# Patient Record
Sex: Female | Born: 1980 | Race: White | Hispanic: No | Marital: Married | Smoking: Current every day smoker
Health system: Southern US, Community
[De-identification: ages and names within clinical notes are randomized; demographics above are authoritative.]

## PROBLEM LIST (undated history)

## (undated) DIAGNOSIS — E079 Disorder of thyroid, unspecified: Secondary | ICD-10-CM

## (undated) HISTORY — PX: CARPAL TUNNEL RELEASE: SHX101

## (undated) HISTORY — PX: OTHER SURGICAL HISTORY: SHX169

## (undated) HISTORY — PX: ANKLE SURGERY: SHX546

---

## 2014-09-12 ENCOUNTER — Emergency Department (HOSPITAL_COMMUNITY)
Admission: EM | Admit: 2014-09-12 | Discharge: 2014-09-12 | Disposition: A | Discharge status: Home / Self Care | Payer: Medicaid - Out of State | Attending: Emergency Medicine | Admitting: Emergency Medicine

## 2014-09-12 ENCOUNTER — Encounter (HOSPITAL_COMMUNITY): Payer: Self-pay | Admitting: *Deleted

## 2014-09-12 DIAGNOSIS — Z720 Tobacco use: Secondary | ICD-10-CM | POA: Diagnosis not present

## 2014-09-12 DIAGNOSIS — Z8639 Personal history of other endocrine, nutritional and metabolic disease: Secondary | ICD-10-CM | POA: Insufficient documentation

## 2014-09-12 DIAGNOSIS — Z880 Allergy status to penicillin: Secondary | ICD-10-CM | POA: Diagnosis not present

## 2014-09-12 DIAGNOSIS — J0110 Acute frontal sinusitis, unspecified: Secondary | ICD-10-CM | POA: Diagnosis not present

## 2014-09-12 DIAGNOSIS — R110 Nausea: Secondary | ICD-10-CM | POA: Diagnosis not present

## 2014-09-12 DIAGNOSIS — H9209 Otalgia, unspecified ear: Secondary | ICD-10-CM | POA: Diagnosis not present

## 2014-09-12 DIAGNOSIS — R05 Cough: Secondary | ICD-10-CM | POA: Diagnosis present

## 2014-09-12 HISTORY — DX: Disorder of thyroid, unspecified: E07.9

## 2014-09-12 MED ORDER — SALINE SPRAY 0.65 % NA SOLN: 1.0000 | NASAL | Status: DC | PRN

## 2014-09-12 MED ORDER — DOXYCYCLINE HYCLATE 100 MG PO CAPS: 100.0000 mg | ORAL_CAPSULE | Freq: Two times a day (BID) | ORAL | Status: DC

## 2014-09-12 NOTE — ED Notes (Signed)
Nasal congestion, drainage is dark green in color. Dry cough. Sinus pressure.

## 2014-09-12 NOTE — Discharge Instructions (Signed)
You may take 400mg  - 600mg  Ibuprofen (Motrin) every 6-8 hours for fever and pain  Alternate with Tylenol  You may take 500mg  Tylenol every 4-6 hours as needed for fever and pain  Follow-up with your primary care provider next week for recheck of symptoms if not improving.  Be sure to drink plenty of fluids and rest, at least 8hrs of sleep a night, preferably more while you are sick. Return to the ED if you cannot keep down fluids/signs of dehydration, fever not reducing with Tylenol, difficulty breathing/wheezing, stiff neck, worsening condition, or other concerns (see below)

## 2014-09-12 NOTE — ED Provider Notes (Signed)
CSN: [id number]     Arrival date & time 09/12/14  4098 History   First MD Initiated Contact with Patient 09/12/14 1001     Chief Complaint  Patient presents with  . Cough     (Consider location/radiation/quality/duration/timing/severity/associated sxs/prior Treatment) HPI  Pt is a 34yo female c/o gradually worsening nasal congestion, associated with dark green mucous, frontal sinus pressure with headache and dry cough. Pt also c/o bilateral ear pain.  Symptoms started 2 weeks ago. States she has tried OTC medications w/o relief. Denies fever, chills, vomiting or diarrhea but is c/o mild nausea.  LMP: 09/05/14. Daughter is sick at home with nasal congestion, taking antibiotics. No recent travel.   Past Medical History  Diagnosis Date  . Thyroid disease     hypthyroid   Past Surgical History  Procedure Laterality Date  . Cesarean section    . Uterine ablasion     No family history on file. History  Substance Use Topics  . Smoking status: Current Every Day Smoker    Types: Cigarettes  . Smokeless tobacco: Not on file  . Alcohol Use: No   OB History    No data available     Review of Systems  Constitutional: Negative for fever, chills and appetite change.  HENT: Positive for congestion, ear pain, rhinorrhea and sore throat. Negative for ear discharge, trouble swallowing and voice change.   Respiratory: Positive for cough. Negative for shortness of breath.   Cardiovascular: Negative for chest pain and palpitations.  Gastrointestinal: Positive for nausea. Negative for vomiting, abdominal pain and diarrhea.  Musculoskeletal: Negative for myalgias, joint swelling and arthralgias.  Neurological: Positive for headaches ( frontal).  All other systems reviewed and are negative.     Allergies  Penicillins  Home Medications   Prior to Admission medications   Medication Sig Start Date End Date Taking? Authorizing Provider  doxycycline (VIBRAMYCIN) 100 MG capsule Take 1 capsule  (100 mg total) by mouth 2 (two) times daily. One po bid x 7 days 09/12/14   Junius Finner, PA-C  sodium chloride (OCEAN) 0.65 % SOLN nasal spray Place 1 spray into both nostrils as needed for congestion. 09/12/14   Junius Finner, PA-C   BP 136/85 mmHg  Pulse 83  Temp(Src) 98.2 F (36.8 C) (Oral)  Resp 16  Ht  (1.676 m)  Wt 280 lb (127.007 kg)  BMI 45.21 kg/m2  SpO2 100%  LMP 09/05/2014 Physical Exam  Constitutional: She appears well-developed and well-nourished. No distress.  HENT:  Head: Normocephalic and atraumatic.  Right Ear: Hearing, tympanic membrane, external ear and ear canal normal.  Left Ear: Hearing, tympanic membrane, external ear and ear canal normal.  Nose: Mucosal edema present. Right sinus exhibits frontal sinus tenderness. Right sinus exhibits no maxillary sinus tenderness. Left sinus exhibits frontal sinus tenderness. Left sinus exhibits no maxillary sinus tenderness.  Mouth/Throat: Uvula is midline and mucous membranes are normal. Posterior oropharyngeal erythema present. No oropharyngeal exudate, posterior oropharyngeal edema or tonsillar abscesses.  Eyes: Conjunctivae are normal. No scleral icterus.  Neck: Normal range of motion.  Cardiovascular: Normal rate, regular rhythm and normal heart sounds.   Pulmonary/Chest: Effort normal and breath sounds normal. No respiratory distress. She has no wheezes. She has no rales. She exhibits no tenderness.  Abdominal: Soft. Bowel sounds are normal. She exhibits no distension and no mass. There is no tenderness. There is no rebound and no guarding.  Musculoskeletal: Normal range of motion.  Neurological: She is alert.  Skin: Skin  is warm and dry. She is not diaphoretic.  Nursing note and vitals reviewed.   ED Course  Procedures (including critical care time) Labs Review Labs Reviewed - No data to display  Imaging Review No results found.   EKG Interpretation None      MDM   Final diagnoses:  Acute frontal  sinusitis, recurrence not specified    Pt presenting to ED with c/o gradually worsening nasal congestion and frontal headache. Pt is afebrile, appears well, non-toxic. Lungs: CTAB. Will tx for frontal sinusitis. Doubt pneumonia.  Home care instructions provided. Rx: doxycycline and ocean saline nasal spray. Advised to f/u with PCP if not improving. Return precautions provided. Pt verbalized understanding and agreement with tx plan.     Junius Finnerrin O'Malley, PA-C 09/12/14 1448  Donnetta HutchingBrian Cook, MD 09/14/14 [phone]

## 2015-02-14 ENCOUNTER — Encounter (HOSPITAL_COMMUNITY): Payer: Self-pay | Admitting: *Deleted

## 2015-02-14 ENCOUNTER — Emergency Department (HOSPITAL_COMMUNITY)
Admission: EM | Admit: 2015-02-14 | Discharge: 2015-02-14 | Disposition: A | Discharge status: Home / Self Care | Payer: Medicaid - Out of State | Attending: Emergency Medicine | Admitting: Emergency Medicine

## 2015-02-14 ENCOUNTER — Emergency Department (HOSPITAL_COMMUNITY): Payer: Medicaid - Out of State

## 2015-02-14 DIAGNOSIS — Z79899 Other long term (current) drug therapy: Secondary | ICD-10-CM | POA: Diagnosis not present

## 2015-02-14 DIAGNOSIS — R1030 Lower abdominal pain, unspecified: Secondary | ICD-10-CM | POA: Diagnosis present

## 2015-02-14 DIAGNOSIS — E039 Hypothyroidism, unspecified: Secondary | ICD-10-CM | POA: Insufficient documentation

## 2015-02-14 DIAGNOSIS — R1031 Right lower quadrant pain: Secondary | ICD-10-CM | POA: Diagnosis not present

## 2015-02-14 DIAGNOSIS — Z720 Tobacco use: Secondary | ICD-10-CM | POA: Diagnosis not present

## 2015-02-14 DIAGNOSIS — R1032 Left lower quadrant pain: Secondary | ICD-10-CM | POA: Diagnosis not present

## 2015-02-14 DIAGNOSIS — R109 Unspecified abdominal pain: Secondary | ICD-10-CM

## 2015-02-14 DIAGNOSIS — Z880 Allergy status to penicillin: Secondary | ICD-10-CM | POA: Diagnosis not present

## 2015-02-14 DIAGNOSIS — Z3202 Encounter for pregnancy test, result negative: Secondary | ICD-10-CM | POA: Insufficient documentation

## 2015-02-14 LAB — URINALYSIS, ROUTINE W REFLEX MICROSCOPIC
Bilirubin Urine: NEGATIVE
GLUCOSE, UA: NEGATIVE mg/dL
KETONES UR: NEGATIVE mg/dL
NITRITE: NEGATIVE
PH: 6 (ref 5.0–8.0)
Protein, ur: NEGATIVE mg/dL
Specific Gravity, Urine: <1.005 — ABNORMAL LOW (ref 1.005–1.030)
Urobilinogen, UA: 0.2 mg/dL (ref 0.0–1.0)

## 2015-02-14 LAB — COMPREHENSIVE METABOLIC PANEL
ALT: 20 U/L (ref 14–54)
ANION GAP: 7 (ref 5–15)
AST: 18 U/L (ref 15–41)
Albumin: 3.7 g/dL (ref 3.5–5.0)
Alkaline Phosphatase: 93 U/L (ref 38–126)
BILIRUBIN TOTAL: 0.5 mg/dL (ref 0.3–1.2)
BUN: 11 mg/dL (ref 6–20)
CO2: 28 mmol/L (ref 22–32)
Calcium: 9 mg/dL (ref 8.9–10.3)
Chloride: 104 mmol/L (ref 101–111)
Creatinine, Ser: 0.73 mg/dL (ref 0.44–1.00)
GFR calc non Af Amer: >60 mL/min (ref >60)
GLUCOSE: 94 mg/dL (ref 65–99)
POTASSIUM: 4 mmol/L (ref 3.5–5.1)
Sodium: 139 mmol/L (ref 135–145)
TOTAL PROTEIN: 7 g/dL (ref 6.5–8.1)

## 2015-02-14 LAB — I-STAT CHEM 8, ED
BUN: 10 mg/dL (ref 6–20)
Calcium, Ion: 1.2 mmol/L (ref 1.12–1.23)
Chloride: 103 mmol/L (ref 101–111)
Creatinine, Ser: 0.9 mg/dL (ref 0.44–1.00)
Glucose, Bld: 87 mg/dL (ref 65–99)
HCT: 50 % — ABNORMAL HIGH (ref 36.0–46.0)
HEMOGLOBIN: 17 g/dL — AB (ref 12.0–15.0)
Potassium: 3.9 mmol/L (ref 3.5–5.1)
SODIUM: 140 mmol/L (ref 135–145)
TCO2: 25 mmol/L (ref 0–100)

## 2015-02-14 LAB — CBC WITH DIFFERENTIAL/PLATELET
Basophils Absolute: 0 10*3/uL (ref 0.0–0.1)
Basophils Relative: 0 % (ref 0–1)
Eosinophils Absolute: 0.1 10*3/uL (ref 0.0–0.7)
Eosinophils Relative: 1 % (ref 0–5)
HCT: 46.6 % — ABNORMAL HIGH (ref 36.0–46.0)
Hemoglobin: 15.7 g/dL — ABNORMAL HIGH (ref 12.0–15.0)
LYMPHS ABS: 2.2 10*3/uL (ref 0.7–4.0)
LYMPHS PCT: 21 % (ref 12–46)
MCH: 29.9 pg (ref 26.0–34.0)
MCHC: 33.7 g/dL (ref 30.0–36.0)
MCV: 88.8 fL (ref 78.0–100.0)
Monocytes Absolute: 0.8 10*3/uL (ref 0.1–1.0)
Monocytes Relative: 8 % (ref 3–12)
Neutro Abs: 7.6 10*3/uL (ref 1.7–7.7)
Neutrophils Relative %: 70 % (ref 43–77)
Platelets: 267 10*3/uL (ref 150–400)
RBC: 5.25 MIL/uL — AB (ref 3.87–5.11)
RDW: 13.6 % (ref 11.5–15.5)
WBC: 10.9 10*3/uL — AB (ref 4.0–10.5)

## 2015-02-14 LAB — URINE MICROSCOPIC-ADD ON

## 2015-02-14 LAB — PREGNANCY, URINE: PREG TEST UR: NEGATIVE

## 2015-02-14 LAB — LIPASE, BLOOD: LIPASE: 22 U/L (ref 22–51)

## 2015-02-14 MED ORDER — KETOROLAC TROMETHAMINE 60 MG/2ML IM SOLN
30.0000 mg | Freq: Once | INTRAMUSCULAR | Status: DC
  Filled 2015-02-14: qty 2

## 2015-02-14 MED ORDER — KETOROLAC TROMETHAMINE 30 MG/ML IJ SOLN
30.0000 mg | Freq: Once | INTRAMUSCULAR | Status: AC
  Administered 2015-02-14: 30 mg via INTRAVENOUS
  Filled 2015-02-14: qty 1

## 2015-02-14 MED ORDER — IBUPROFEN 800 MG PO TABS: 800.0000 mg | ORAL_TABLET | Freq: Three times a day (TID) | ORAL | Status: DC

## 2015-02-14 MED ORDER — PROMETHAZINE HCL 25 MG PO TABS: 25.0000 mg | ORAL_TABLET | Freq: Four times a day (QID) | ORAL | Status: DC | PRN

## 2015-02-14 NOTE — ED Provider Notes (Signed)
CSN: [id number]     Arrival date & time 02/14/15  4098 History  This chart was scribed for Eber Hong, MD by Placido Sou, ED scribe. This patient was seen in room [facility] and the patient's care was started at 9:30 AM.    Chief Complaint  Patient presents with  . Abdominal Pain    The history is provided by the patient. No language interpreter was used.   HPI Comments: Ann Morrison is a 34 y.o. female, with a history of uterine ablation, UTI, and a c-section, who presents to the Emergency Department by ambulance complaining of moderate, sudden onset, lower abdominal pain that began this morning while driving. She describes it as a "stabbing" pain. She further notes a history of cysts on her ovaryies. She notes a worsening of symptoms with any type of ambulation or breathing. Pt notes her c-section was in 2014. Pt denies any history of illnesses and further denies any abnormal bowel movements, nausea, vomiting, or diarrhea.   Past Medical History  Diagnosis Date  . Thyroid disease     hypthyroid   Past Surgical History  Procedure Laterality Date  . Cesarean section    . Uterine ablasion     No family history on file. History  Substance Use Topics  . Smoking status: Current Every Day Smoker    Types: Cigarettes  . Smokeless tobacco: Not on file  . Alcohol Use: No   OB History    No data available     Review of Systems  Gastrointestinal: Positive for abdominal pain. Negative for nausea, vomiting and diarrhea.  All other systems reviewed and are negative.     Allergies  Penicillins  Home Medications   Prior to Admission medications   Medication Sig Start Date End Date Taking? Authorizing Provider  acetaminophen (TYLENOL) 500 MG tablet Take 1,000 mg by mouth every 6 (six) hours as needed for moderate pain.   Yes Historical Provider, MD  buPROPion (WELLBUTRIN SR) 150 MG 12 hr tablet Take 150 mg by mouth daily. 11/10/14  Yes Historical Provider, MD  levothyroxine  (SYNTHROID, LEVOTHROID) 125 MCG tablet Take 125 mcg by mouth daily. 01/16/15  Yes Historical Provider, MD  omeprazole (PRILOSEC OTC) 20 MG tablet Take 20 mg by mouth every other day.   Yes Historical Provider, MD  sodium chloride (OCEAN) 0.65 % SOLN nasal spray Place 1 spray into both nostrils as needed for congestion. 09/12/14  Yes Junius Finner, PA-C  doxycycline (VIBRAMYCIN) 100 MG capsule Take 1 capsule (100 mg total) by mouth 2 (two) times daily. One po bid x 7 days Patient not taking: Reported on 02/14/2015 09/12/14   Junius Finner, PA-C  ibuprofen (ADVIL,MOTRIN) 800 MG tablet Take 1 tablet (800 mg total) by mouth 3 (three) times daily. 02/14/15   Eber Hong, MD  promethazine (PHENERGAN) 25 MG tablet Take 1 tablet (25 mg total) by mouth every 6 (six) hours as needed for nausea or vomiting. 02/14/15   Eber Hong, MD   BP 126/47 mmHg  Pulse 66  Temp(Src) 98 F (36.7 C) (Oral)  Resp 16  Ht  (1.651 m)  Wt 308 lb (139.708 kg)  BMI 51.25 kg/m2  SpO2 100%  LMP 01/31/2015 Physical Exam  Constitutional: She is oriented to person, place, and time. She appears well-developed and well-nourished. No distress.  HENT:  Head: Normocephalic and atraumatic.  Mouth/Throat: Oropharynx is clear and moist. No oropharyngeal exudate.  Eyes: Conjunctivae and EOM are normal. Pupils are equal, round, and reactive  to light. Right eye exhibits no discharge. Left eye exhibits no discharge. No scleral icterus.  Neck: Normal range of motion. Neck supple. No JVD present. No thyromegaly present.  Cardiovascular: Normal rate, regular rhythm, normal heart sounds and intact distal pulses.  Exam reveals no gallop and no friction rub.   No murmur heard. Pulmonary/Chest: Effort normal and breath sounds normal. No respiratory distress. She has no wheezes. She has no rales.  Abdominal: Soft. Bowel sounds are normal. There is tenderness. There is guarding.  Acute onset of sharp and stabbing pain while driving; persistent  worsening with movement; Tenderness in bilateral lower abd; mobese bilateral lower abd tenderness with mild guarding; no upper abd tenderness;   Musculoskeletal: Normal range of motion. She exhibits no edema or tenderness.  Lymphadenopathy:    She has no cervical adenopathy.  Neurological: She is alert and oriented to person, place, and time. Coordination normal.  Skin: Skin is warm and dry. No rash noted. No erythema.  Psychiatric: She has a normal mood and affect. Her behavior is normal.  Nursing note and vitals reviewed.   ED Course  Procedures  DIAGNOSTIC STUDIES: Oxygen Saturation is 99% on RA, normal by my interpretation.    COORDINATION OF CARE: 9:36 AM Discussed treatment plan with pt at bedside and pt agreed to plan.  10:45 AM Followed up with pt and pain is rated as a 6/10. Labs and CT scan reviewed with the patient.  Labs Review Labs Reviewed  CBC WITH DIFFERENTIAL/PLATELET - Abnormal; Notable for the following:    WBC 10.9 (*)    RBC 5.25 (*)    Hemoglobin 15.7 (*)    HCT 46.6 (*)    All other components within normal limits  URINALYSIS, ROUTINE W REFLEX MICROSCOPIC (NOT AT [facility]) - Abnormal; Notable for the following:    Specific Gravity, Urine <1.005 (*)    Hgb urine dipstick TRACE (*)    Leukocytes, UA TRACE (*)    All other components within normal limits  URINE MICROSCOPIC-ADD ON - Abnormal; Notable for the following:    Squamous Epithelial / LPF MANY (*)    Bacteria, UA FEW (*)    All other components within normal limits  I-STAT CHEM 8, ED - Abnormal; Notable for the following:    Hemoglobin 17.0 (*)    HCT 50.0 (*)    All other components within normal limits  COMPREHENSIVE METABOLIC PANEL  LIPASE, BLOOD  PREGNANCY, URINE    Imaging Review Ct Renal Stone Study  02/14/2015   CLINICAL DATA:  Low pelvic pain  EXAM: CT ABDOMEN AND PELVIS WITHOUT CONTRAST  TECHNIQUE: Multidetector CT imaging of the abdomen and pelvis was performed following the standard  protocol without IV contrast.  COMPARISON:  None.  FINDINGS: BODY WALL: No contributory findings.  LOWER CHEST: No contributory findings.  ABDOMEN/PELVIS:  Liver: Borderline hepatic steatosis when compared to the spleen.  Biliary: No evidence of biliary obstruction or stone.  Pancreas: Unremarkable.  Spleen: Unremarkable.  Adrenals: Unremarkable.  Kidneys and ureters: No hydronephrosis or stone. A punctate density near the right ureter on image 71 appears external to the ureter on reformats.  Bladder: Unremarkable.  Reproductive: IUD which is in good position. No pathologic findings.  Bowel: No obstruction. No appendicitis.  Retroperitoneum: No mass or adenopathy.  Peritoneum: No ascites or pneumoperitoneum.  Vascular: No acute abnormality.  OSSEOUS: No acute abnormalities.  IMPRESSION: 1. No explanation for acute abdominal pain. 2. Well-positioned IUD.   Electronically Signed   By:  Marnee Spring M.D.   On: 02/14/2015 10:21      MDM   Final diagnoses:  Abdominal pain   Renal Ultrasound   Emergency Focused Ultrasound Exam Limited retroperitoneal ultrasound of kidneys  Performed and interpreted by Dr. Hyacinth Meeker Indication: flank pain Focused abdominal ultrasound with both kidneys imaged in transverse and longitudinal planes in real-time. Interpretation:  hydronephrosis visualized.   Images archived electronically   CT and labs unremarkable - after toradol improved signfiicantly - VS normal - stable for d/c.  Pt informed of all results and indications for returnl  I personally performed the services described in this documentation, which was scribed in my presence. The recorded information has been reviewed and is accurate.       Eber Hong, MD 02/14/15 1302

## 2015-02-14 NOTE — ED Notes (Signed)
MD at bedside. 

## 2015-02-14 NOTE — ED Notes (Signed)
Pt coems in by [facility]. Pt was driving to work and began having sudden lower abdominal pain. NAD noted.  Pt got Fentanyl in route and is rating pain at 6/10.

## 2015-02-14 NOTE — ED Notes (Signed)
Lab at bedside

## 2015-02-14 NOTE — ED Notes (Signed)
Patient with no complaints at this time. Respirations even and unlabored. Skin warm/dry. Discharge instructions reviewed with patient at this time. Patient given opportunity to voice concerns/ask questions. IV removed per policy and band-aid applied to site. Patient discharged at this time and left Emergency Department with steady gait.  

## 2015-02-14 NOTE — Discharge Instructions (Signed)
Please call your doctor for a followup appointment within 24-48 hours. When you talk to your doctor please let them know that you were seen in the emergency department and have them acquire all of your records so that they can discuss the findings with you and formulate a treatment plan to fully care for your new and ongoing problems. ° °

## 2016-04-29 ENCOUNTER — Encounter (HOSPITAL_COMMUNITY): Payer: Self-pay | Admitting: Emergency Medicine

## 2016-04-29 ENCOUNTER — Emergency Department (HOSPITAL_COMMUNITY)
Admission: EM | Admit: 2016-04-29 | Discharge: 2016-04-29 | Disposition: A | Discharge status: Home / Self Care | Payer: Medicaid - Out of State | Attending: Emergency Medicine | Admitting: Emergency Medicine

## 2016-04-29 DIAGNOSIS — F17210 Nicotine dependence, cigarettes, uncomplicated: Secondary | ICD-10-CM | POA: Insufficient documentation

## 2016-04-29 DIAGNOSIS — J209 Acute bronchitis, unspecified: Secondary | ICD-10-CM | POA: Insufficient documentation

## 2016-04-29 MED ORDER — ALBUTEROL SULFATE HFA 108 (90 BASE) MCG/ACT IN AERS
2.0000 | INHALATION_SPRAY | Freq: Once | RESPIRATORY_TRACT | Status: AC
  Administered 2016-04-29: 2 via RESPIRATORY_TRACT
  Filled 2016-04-29: qty 6.7

## 2016-04-29 MED ORDER — DIPHENHYDRAMINE HCL 25 MG PO TABS: 25.0000 mg | ORAL_TABLET | Freq: Four times a day (QID) | ORAL | 0 refills | Status: DC | PRN

## 2016-04-29 MED ORDER — PREDNISONE 20 MG PO TABS
40.0000 mg | ORAL_TABLET | Freq: Once | ORAL | Status: AC
Start: 2016-04-29 — End: 2016-04-29
  Administered 2016-04-29: 40 mg via ORAL
  Filled 2016-04-29: qty 2

## 2016-04-29 MED ORDER — DEXAMETHASONE 4 MG PO TABS: 4.0000 mg | ORAL_TABLET | Freq: Two times a day (BID) | ORAL | 0 refills | Status: DC

## 2016-04-29 MED ORDER — DOXYCYCLINE HYCLATE 100 MG PO TABS
100.0000 mg | ORAL_TABLET | Freq: Once | ORAL | Status: AC
  Administered 2016-04-29: 100 mg via ORAL
  Filled 2016-04-29: qty 1

## 2016-04-29 MED ORDER — DOXYCYCLINE HYCLATE 100 MG PO CAPS: 100.0000 mg | ORAL_CAPSULE | Freq: Two times a day (BID) | ORAL | 0 refills | Status: DC

## 2016-04-29 NOTE — ED Triage Notes (Signed)
Pt reports sinus congestion x2 weeks with cough and green sputum.  Pt has been taking otc dayquil with no relief.

## 2016-04-29 NOTE — ED Provider Notes (Signed)
[facility]-EMERGENCY DEPT Provider Note   CSN: [id number] Arrival date & time: 04/29/16  1200   By signing my name below, I, Ann Morrison, attest that this documentation has been prepared under the direction and in the presence of  Ann Quale, PA-C. Electronically Signed: Christy Morrison, ED Scribe. 04/29/16. 1:32 PM.  History   Chief Complaint Chief Complaint  Patient presents with  . Sinus Problem   The history is provided by the patient. No language interpreter was used.     HPI Comments:  Ann Morrison is a 35 y.o. female who presents to the Emergency Department complaining of nasal congestion and cough productive of yellow-green sputum beginning over a week ago.  Pt has been taking day-quill without relief.  No additional symptoms or complaints noted.   Past Medical History:  Diagnosis Date  . Thyroid disease    hypthyroid    There are no active problems to display for this patient.   Past Surgical History:  Procedure Laterality Date  . CESAREAN SECTION    . uterine ablasion      OB History    No data available       Home Medications    Prior to Admission medications   Medication Sig Start Date End Date Taking? Authorizing Provider  acetaminophen (TYLENOL) 500 MG tablet Take 1,000 mg by mouth every 6 (six) hours as needed for moderate pain.    Historical Provider, MD  buPROPion (WELLBUTRIN SR) 150 MG 12 hr tablet Take 150 mg by mouth daily. 11/10/14   Historical Provider, MD  doxycycline (VIBRAMYCIN) 100 MG capsule Take 1 capsule (100 mg total) by mouth 2 (two) times daily. One po bid x 7 days Patient not taking: Reported on 02/14/2015 09/12/14   Junius Finner, PA-C  ibuprofen (ADVIL,MOTRIN) 800 MG tablet Take 1 tablet (800 mg total) by mouth 3 (three) times daily. 02/14/15   Eber Hong, MD  levothyroxine (SYNTHROID, LEVOTHROID) 125 MCG tablet Take 125 mcg by mouth daily. 01/16/15   Historical Provider, MD  omeprazole (PRILOSEC OTC) 20 MG tablet Take 20 mg  by mouth every other day.    Historical Provider, MD  promethazine (PHENERGAN) 25 MG tablet Take 1 tablet (25 mg total) by mouth every 6 (six) hours as needed for nausea or vomiting. 02/14/15   Eber Hong, MD  sodium chloride (OCEAN) 0.65 % SOLN nasal spray Place 1 spray into both nostrils as needed for congestion. 09/12/14   Junius Finner, PA-C    Family History History reviewed. No pertinent family history.  Social History Social History  Substance Use Topics  . Smoking status: Current Every Day Smoker    Types: Cigarettes  . Smokeless tobacco: Not on file  . Alcohol use No     Allergies   Penicillins   Review of Systems Review of Systems  HENT: Positive for congestion.   Respiratory: Positive for cough.   All other systems reviewed and are negative.    Physical Exam Updated Vital Signs BP (!) 108/54 (BP Location: Left Arm)   Pulse 90   Temp 98.2 F (36.8 C) (Oral)   Resp 16   Ht 5\' 5"  (1.651 m)   Wt 300 lb (136.1 kg)   LMP 04/23/2016 (Exact Date)   SpO2 98%   BMI 49.92 kg/m   Physical Exam  Constitutional: She is oriented to person, place, and time. She appears well-developed and well-nourished. No distress.  HENT:  Head: Normocephalic and atraumatic.  Mouth/Throat: Oropharyngeal exudate present.  Eyes: Conjunctivae  are normal.  Neck:  No cervical lymphadenopathy.  Cardiovascular: Normal rate, regular rhythm and normal heart sounds.   Pulmonary/Chest: Effort normal. She has wheezes.  Symmetrical rise fall chest.  Soft expiratory wheezes present bilaterally.  Bilateral rhonchii present.     Musculoskeletal:  Speaks in complete sentences.  Neurological: She is alert and oriented to person, place, and time.  Skin: Skin is warm and dry. Capillary refill takes less than 2 seconds.  Psychiatric: She has a normal mood and affect.  Nursing note and vitals reviewed.   ED Treatments / Results   DIAGNOSTIC STUDIES:  Oxygen Saturation is 98% on RA, NML by my  interpretation.    COORDINATION OF CARE:  2:04 PM Discussed treatment plan with pt at bedside and pt agreed to plan.  Labs (all labs ordered are listed, but only abnormal results are displayed) Labs Reviewed - No data to display  EKG  EKG Interpretation None       Radiology No results found.  Procedures Procedures (including critical care time)  Medications Ordered in ED Medications  predniSONE (DELTASONE) tablet 40 mg (not administered)  albuterol (PROVENTIL HFA;VENTOLIN HFA) 108 (90 Base) MCG/ACT inhaler 2 puff (not administered)  doxycycline (VIBRA-TABS) tablet 100 mg (not administered)     Initial Impression / Assessment and Plan / ED Course  I have reviewed the triage vital signs and the nursing notes.  Pertinent labs & imaging results that were available during my care of the patient were reviewed by me and considered in my medical decision making (see chart for details).  Clinical Course    **I have reviewed nursing notes, vital signs, and all appropriate lab and imaging results for this patient.*  Final Clinical Impressions(s) / ED Diagnoses  The examination is consistent with acute bronchitis. No high fever. No sign of shock. The pulse oximetry is 98% on room air. The vital signs are within normal limits. The patient will be treated with Decadron, doxycycline, albuterol, and Benadryl on. The patient is to follow-up with her Medicaid access physician, or return to the emergency department if any changes, problems, or concerns.    Final diagnoses:  Acute bronchitis, unspecified organism    New Prescriptions New Prescriptions   No medications on file   **I personally performed the services described in this documentation, which was scribed in my presence. The recorded information has been reviewed and is accurate.Ann Morrison*    Ann Helmkamp, PA-C 04/29/16 1412    Ann BerkshireJoseph Zammit, MD 05/02/16 1021

## 2016-04-29 NOTE — Discharge Instructions (Signed)
Please increase fluids. Wash hands frequently. Use 2 puffs of albuterol every 4 hours for wheezing and cough. Use doxycycline and Decadron 2 times daily with food. Use Benadryl every 6 hours as needed for congestion and cough. Benadryl may cause drowsiness, please do not drive, drink alcohol, operate machinery, or participate in activities requiring concentration when taking this medication.

## 2017-06-23 ENCOUNTER — Encounter (HOSPITAL_COMMUNITY): Payer: Self-pay

## 2017-06-23 ENCOUNTER — Emergency Department (HOSPITAL_COMMUNITY)
Admission: EM | Admit: 2017-06-23 | Discharge: 2017-06-23 | Disposition: A | Discharge status: Home / Self Care | Payer: 59 | Attending: Emergency Medicine | Admitting: Emergency Medicine

## 2017-06-23 DIAGNOSIS — E860 Dehydration: Secondary | ICD-10-CM | POA: Diagnosis not present

## 2017-06-23 DIAGNOSIS — F17210 Nicotine dependence, cigarettes, uncomplicated: Secondary | ICD-10-CM | POA: Diagnosis not present

## 2017-06-23 DIAGNOSIS — E039 Hypothyroidism, unspecified: Secondary | ICD-10-CM | POA: Insufficient documentation

## 2017-06-23 DIAGNOSIS — R42 Dizziness and giddiness: Secondary | ICD-10-CM | POA: Diagnosis present

## 2017-06-23 DIAGNOSIS — Z79899 Other long term (current) drug therapy: Secondary | ICD-10-CM | POA: Diagnosis not present

## 2017-06-23 DIAGNOSIS — R110 Nausea: Secondary | ICD-10-CM | POA: Insufficient documentation

## 2017-06-23 LAB — CBC WITH DIFFERENTIAL/PLATELET
Basophils Absolute: 0.1 10*3/uL (ref 0.0–0.1)
Basophils Relative: 0 %
Eosinophils Absolute: 0.2 10*3/uL (ref 0.0–0.7)
Eosinophils Relative: 1 %
HEMATOCRIT: 49.4 % — AB (ref 36.0–46.0)
Hemoglobin: 16.6 g/dL — ABNORMAL HIGH (ref 12.0–15.0)
LYMPHS ABS: 4.5 10*3/uL — AB (ref 0.7–4.0)
LYMPHS PCT: 36 %
MCH: 31 pg (ref 26.0–34.0)
MCHC: 33.6 g/dL (ref 30.0–36.0)
MCV: 92.3 fL (ref 78.0–100.0)
MONO ABS: 0.6 10*3/uL (ref 0.1–1.0)
Monocytes Relative: 5 %
NEUTROS ABS: 7.1 10*3/uL (ref 1.7–7.7)
Neutrophils Relative %: 58 %
PLATELETS: 277 10*3/uL (ref 150–400)
RBC: 5.35 MIL/uL — ABNORMAL HIGH (ref 3.87–5.11)
RDW: 13.2 % (ref 11.5–15.5)
WBC: 12.3 10*3/uL — ABNORMAL HIGH (ref 4.0–10.5)

## 2017-06-23 LAB — COMPREHENSIVE METABOLIC PANEL
ALBUMIN: 4.1 g/dL (ref 3.5–5.0)
ALT: 33 U/L (ref 14–54)
ANION GAP: 10 (ref 5–15)
AST: 28 U/L (ref 15–41)
Alkaline Phosphatase: 85 U/L (ref 38–126)
BUN: 8 mg/dL (ref 6–20)
CO2: 28 mmol/L (ref 22–32)
Calcium: 10.1 mg/dL (ref 8.9–10.3)
Chloride: 104 mmol/L (ref 101–111)
Creatinine, Ser: 0.76 mg/dL (ref 0.44–1.00)
GFR calc Af Amer: >60 mL/min (ref >60)
GFR calc non Af Amer: >60 mL/min (ref >60)
GLUCOSE: 80 mg/dL (ref 65–99)
POTASSIUM: 3.7 mmol/L (ref 3.5–5.1)
Sodium: 142 mmol/L (ref 135–145)
Total Bilirubin: 0.5 mg/dL (ref 0.3–1.2)
Total Protein: 7.2 g/dL (ref 6.5–8.1)

## 2017-06-23 LAB — POC URINE PREG, ED: Preg Test, Ur: NEGATIVE

## 2017-06-23 MED ORDER — LEVOTHYROXINE SODIUM 125 MCG PO TABS: 125.0000 ug | ORAL_TABLET | Freq: Every day | ORAL | 1 refills | Status: AC

## 2017-06-23 MED ORDER — SODIUM CHLORIDE 0.9 % IV BOLUS (SEPSIS)
1000.0000 mL | Freq: Once | INTRAVENOUS | Status: AC
  Administered 2017-06-23: 1000 mL via INTRAVENOUS

## 2017-06-23 NOTE — ED Triage Notes (Signed)
Patient reports of dizziness, nausea, diarrhea and tingling since Friday. States she has given plasma 5 times October 1st. Last time was this past thrusday. Denies vision issues or any deficits.  Has not had thyroid medication in 2 months.

## 2017-06-23 NOTE — ED Provider Notes (Signed)
[facility] EMERGENCY DEPARTMENT Provider Note   CSN: [id number] Arrival date & time: 06/23/17  1205     History   Chief Complaint Chief Complaint  Patient presents with  . Dizziness  . Nausea    HPI Ann Morrison is a 36 y.o. female.  HPI  36 year old female with a history of hypothyroidism presents with dizziness/lightheadedness.  She states it feels like sometimes she is about to pass out.  This started 2 days ago when she first woke up.  The day before that she donated plasma, which was the fifth time this month.  Before this month she has never donated plasma before.  She states on all the other times she has never felt this way.  2 days ago it seemed to go away after the morning.  However when she woke up yesterday it was back and has persisted.  She tried to drink a lot of water and Gatorade with some relief.  However when she woke up this morning it is still dizzy and she still feels lightheaded.  There is no spinning sensation.  She states she has a very slight headache but not bad enough that she would want to take medicine for.  Occasionally she will get nauseated.  She denies any blurry vision, chest pain, shortness of breath, fever, focal weakness or numbness.  She has felt intermittent tingling in both of her legs diffusely but that seems to come and go.  The lightheadedness is worst when she is up and walking around.  She had a couple episodes of diarrhea yesterday but that has resolved.  She also has had hypothyroidism for 15 years and has been on Synthroid 125 mcg consistently since then.  However she ran out of her medicine 2 months ago and her doctor is retired.  She has not been able to get anyone to write her a refill and so she has not taken it for 2 months.  Past Medical History:  Diagnosis Date  . Thyroid disease    hypthyroid    There are no active problems to display for this patient.   Past Surgical History:  Procedure Laterality Date  . CESAREAN SECTION     . uterine ablasion      OB History    No data available       Home Medications    Prior to Admission medications   Medication Sig Start Date End Date Taking? Authorizing Provider  ibuprofen (ADVIL,MOTRIN) 200 MG tablet Take 400 mg by mouth every 6 (six) hours as needed for headache, mild pain or moderate pain.   Yes [provider]  acetaminophen (TYLENOL) 500 MG tablet Take 1,000 mg by mouth every 6 (six) hours as needed for moderate pain.    [provider]  buPROPion (WELLBUTRIN SR) 150 MG 12 hr tablet Take 150 mg by mouth daily. 11/10/14   [provider]  levothyroxine (SYNTHROID, LEVOTHROID) 125 MCG tablet Take 1 tablet (125 mcg total) by mouth daily before breakfast. 06/23/17   Pricilla Loveless, MD  omeprazole (PRILOSEC OTC) 20 MG tablet Take 20 mg by mouth every other day.    [provider]    Family History No family history on file.  Social History Social History  Substance Use Topics  . Smoking status: Current Every Day Smoker    Types: Cigarettes  . Smokeless tobacco: Never Used  . Alcohol use No     Allergies   Penicillins   Review of Systems Review of  Systems  Constitutional: Negative for fever.  HENT: Negative for ear pain.   Eyes: Negative for visual disturbance.  Respiratory: Negative for shortness of breath.   Cardiovascular: Negative for chest pain.  Gastrointestinal: Positive for diarrhea and nausea. Negative for abdominal pain and vomiting.  Genitourinary: Negative for dysuria.  Musculoskeletal: Negative for back pain.  Neurological: Positive for light-headedness and headaches. Negative for syncope, weakness and numbness.  All other systems reviewed and are negative.    Physical Exam Updated Vital Signs BP (!) 111/51 (BP Location: Right Arm)   Pulse 73   Temp 97.7 F (36.5 C) (Oral)   Resp 18   Ht 5\' 6"  (1.676 m)   Wt (!) 140.2 kg (309 lb)   LMP 05/27/2017   SpO2 100%   BMI 49.87 kg/m    Physical Exam  Constitutional: She is oriented to person, place, and time. She appears well-developed and well-nourished.  obese  HENT:  Head: Normocephalic and atraumatic.  Right Ear: External ear normal.  Left Ear: External ear normal.  Nose: Nose normal.  Eyes: Pupils are equal, round, and reactive to light. EOM are normal. Right eye exhibits no discharge. Left eye exhibits no discharge.  Neck: Normal range of motion. Neck supple.  Cardiovascular: Normal rate, regular rhythm and normal heart sounds.   No murmur heard. Pulmonary/Chest: Effort normal and breath sounds normal.  Abdominal: Soft. There is no tenderness.  Neurological: She is alert and oriented to person, place, and time.  CN 3-12 grossly intact. 5/5 strength in all 4 extremities. Grossly normal sensation. Normal finger to nose.   Skin: Skin is warm and dry. She is not diaphoretic.  Nursing note and vitals reviewed.    ED Treatments / Results  Labs (all labs ordered are listed, but only abnormal results are displayed) Labs Reviewed  CBC WITH DIFFERENTIAL/PLATELET - Abnormal; Notable for the following:       Result Value   WBC 12.3 (*)    RBC 5.35 (*)    Hemoglobin 16.6 (*)    HCT 49.4 (*)    Lymphs Abs 4.5 (*)    All other components within normal limits  COMPREHENSIVE METABOLIC PANEL  POC URINE PREG, ED    EKG  EKG Interpretation  Date/Time:  Sunday June 23 2017 13:13:16 EDT Ventricular Rate:  81 PR Interval:    QRS Duration: 101 QT Interval:  394 QTC Calculation: 458 R Axis:   24 Text Interpretation:  Normal sinus rhythm Borderline prolonged PR interval Low voltage, extremity leads no acute ST/T changes No old tracing to compare Confirmed by Pricilla Loveless [phone]) on 06/23/2017 1:15:13 PM       Radiology No results found.  Procedures Procedures (including critical care time)  Medications Ordered in ED Medications  sodium chloride 0.9 % bolus 1,000 mL (0 mLs Intravenous Stopped  06/23/17 1425)  sodium chloride 0.9 % bolus 1,000 mL (0 mLs Intravenous Stopped 06/23/17 1512)     Initial Impression / Assessment and Plan / ED Course  I have reviewed the triage vital signs and the nursing notes.  Pertinent labs & imaging results that were available during my care of the patient were reviewed by me and considered in my medical decision making (see chart for details).     Feels better with IV fluids. Labs unremarkable from baseline. Likely combination of being off thyroid meds and frequent plasma donations. Encouraged to drink plenty of fluids, find a PCP for further outpatient care. Hold off on plasma donations. D/c  home with return precautions.  Final Clinical Impressions(s) / ED Diagnoses   Final diagnoses:  Dehydration  Hypothyroidism, unspecified type    New Prescriptions Discharge Medication List as of 06/23/2017  3:10 PM       Pricilla LovelessGoldston, Adithya Difrancesco, MD 06/23/17 2336

## 2017-07-28 ENCOUNTER — Emergency Department (HOSPITAL_COMMUNITY)
Admission: EM | Admit: 2017-07-28 | Discharge: 2017-07-28 | Disposition: A | Discharge status: Home / Self Care | Payer: 59 | Attending: Emergency Medicine | Admitting: Emergency Medicine

## 2017-07-28 ENCOUNTER — Encounter (HOSPITAL_COMMUNITY): Payer: Self-pay | Admitting: Emergency Medicine

## 2017-07-28 ENCOUNTER — Other Ambulatory Visit: Payer: Self-pay

## 2017-07-28 ENCOUNTER — Emergency Department (HOSPITAL_COMMUNITY): Payer: 59

## 2017-07-28 DIAGNOSIS — E039 Hypothyroidism, unspecified: Secondary | ICD-10-CM | POA: Diagnosis not present

## 2017-07-28 DIAGNOSIS — Z79899 Other long term (current) drug therapy: Secondary | ICD-10-CM | POA: Insufficient documentation

## 2017-07-28 DIAGNOSIS — J40 Bronchitis, not specified as acute or chronic: Secondary | ICD-10-CM | POA: Diagnosis not present

## 2017-07-28 DIAGNOSIS — F17210 Nicotine dependence, cigarettes, uncomplicated: Secondary | ICD-10-CM | POA: Insufficient documentation

## 2017-07-28 DIAGNOSIS — R05 Cough: Secondary | ICD-10-CM | POA: Diagnosis present

## 2017-07-28 MED ORDER — ALBUTEROL SULFATE HFA 108 (90 BASE) MCG/ACT IN AERS: 1.0000 | INHALATION_SPRAY | Freq: Four times a day (QID) | RESPIRATORY_TRACT | 0 refills | Status: DC | PRN

## 2017-07-28 MED ORDER — AZITHROMYCIN 250 MG PO TABS: 250.0000 mg | ORAL_TABLET | Freq: Every day | ORAL | 0 refills | Status: DC

## 2017-07-28 MED ORDER — IPRATROPIUM-ALBUTEROL 0.5-2.5 (3) MG/3ML IN SOLN
3.0000 mL | Freq: Once | RESPIRATORY_TRACT | Status: AC
  Administered 2017-07-28: 3 mL via RESPIRATORY_TRACT
  Filled 2017-07-28: qty 3

## 2017-07-28 NOTE — Discharge Instructions (Signed)
Return if any problems.

## 2017-07-28 NOTE — ED Provider Notes (Signed)
[facility] Provider Note   CSN: [id number] Arrival date & time: 07/28/17  1121     History   Chief Complaint Chief Complaint  Patient presents with  . Cough    HPI Ann NightingaleSandra Kampa is a 36 y.o. female.  The history is provided by the patient. No language interpreter was used.  Cough  This is a new problem. The current episode started more than 1 week ago. The problem occurs constantly. The problem has been gradually worsening. The cough is non-productive. There has been no fever. She has tried nothing for the symptoms. The treatment provided no relief. She is not a smoker. Her past medical history is significant for bronchitis.  Pt has a cough and congestion   Past Medical History:  Diagnosis Date  . Thyroid disease    hypthyroid    There are no active problems to display for this patient.   Past Surgical History:  Procedure Laterality Date  . ANKLE SURGERY Left   . CESAREAN SECTION    . uterine ablasion      OB History    Gravida Para Term Preterm AB Living   1 1 1     1    SAB TAB Ectopic Multiple Live Births                   Home Medications    Prior to Admission medications   Medication Sig Start Date End Date Taking? Authorizing Provider  acetaminophen (TYLENOL) 500 MG tablet Take 1,000 mg by mouth every 6 (six) hours as needed for moderate pain.    [provider]  buPROPion (WELLBUTRIN SR) 150 MG 12 hr tablet Take 150 mg by mouth daily. 11/10/14   [provider]  ibuprofen (ADVIL,MOTRIN) 200 MG tablet Take 400 mg by mouth every 6 (six) hours as needed for headache, mild pain or moderate pain.    [provider]  levothyroxine (SYNTHROID, LEVOTHROID) 125 MCG tablet Take 1 tablet (125 mcg total) by mouth daily before breakfast. 06/23/17   Pricilla LovelessGoldston, Scott, MD  omeprazole (PRILOSEC OTC) 20 MG tablet Take 20 mg by mouth every other day.    [provider]    Family History History reviewed. No  pertinent family history.  Social History Social History   Tobacco Use  . Smoking status: Current Every Day Smoker    Packs/day: 1.00    Types: Cigarettes  . Smokeless tobacco: Never Used  Substance Use Topics  . Alcohol use: No  . Drug use: No     Allergies   Penicillins   Review of Systems Review of Systems  Respiratory: Positive for cough.   All other systems reviewed and are negative.    Physical Exam Updated Vital Signs BP (!) 111/57 (BP Location: Left Arm)   Pulse 92   Temp 98.5 F (36.9 C)   Resp 20   Ht 5\' 6"  (1.676 m)   Wt 136.1 kg (300 lb)   SpO2 99%   BMI 48.42 kg/m   Physical Exam  Constitutional: She is oriented to person, place, and time. She appears well-developed and well-nourished.  HENT:  Head: Normocephalic.  Eyes: EOM are normal.  Neck: Normal range of motion.  Cardiovascular: Normal rate and regular rhythm.  Pulmonary/Chest: Effort normal.  Decreased breath sounds.   Abdominal: She exhibits no distension.  Musculoskeletal: Normal range of motion.  Neurological: She is alert and oriented to person, place, and time.  Psychiatric: She has a normal mood and  affect.  Nursing note and vitals reviewed.    ED Treatments / Results  Labs (all labs ordered are listed, but only abnormal results are displayed) Labs Reviewed - No data to display  EKG  EKG Interpretation None       Radiology Dg Chest 2 View  Result Date: 07/28/2017 CLINICAL DATA:  Four day history of cough EXAM: CHEST  2 VIEW COMPARISON:  None. FINDINGS: The lungs are clear without focal pneumonia, edema, pneumothorax or pleural effusion. The cardiopericardial silhouette is within normal limits for size. The visualized bony structures of the thorax are intact. IMPRESSION: No active cardiopulmonary disease. Electronically Signed   By: Kennith CenterEric  Mansell M.D.   On: 07/28/2017 12:43    Procedures Procedures (including critical care time)  Medications Ordered in  ED Medications  ipratropium-albuterol (DUONEB) 0.5-2.5 (3) MG/3ML nebulizer solution 3 mL (3 mLs Nebulization Given 07/28/17 1157)     Initial Impression / Assessment and Plan / ED Course  I have reviewed the triage vital signs and the nursing notes.  Pertinent labs & imaging results that were available during my care of the patient were reviewed by me and considered in my medical decision making (see chart for details).     Pt given albuterol neb,  Chest xray shows no evidence of pneumonia.  Pt advised to follow up with primary MD for recheck in 3-4 days  Final Clinical Impressions(s) / ED Diagnoses   Final diagnoses:  Bronchitis    ED Discharge Orders        Ordered    albuterol (PROVENTIL HFA;VENTOLIN HFA) 108 (90 Base) MCG/ACT inhaler  Every 6 hours PRN     07/28/17 1303    azithromycin (ZITHROMAX) 250 MG tablet  Daily     07/28/17 1303    An After Visit Summary was printed and given to the patient.   Elson AreasSofia, Jilliana Burkes K, PA-C 07/28/17 1305    Cathren LaineSteinl, Kevin, MD 07/28/17 [phone]

## 2017-07-28 NOTE — ED Triage Notes (Signed)
Patient c/o cough with chest congestion, nasal drainage, and sinus pressure x5 days. Per patient productive cough with thick yellow sputum. Denies any fevers. Per patient using over the counter medication with no relief (Dayquil).

## 2017-07-28 NOTE — ED Notes (Signed)
To radiology

## 2017-07-28 NOTE — ED Notes (Signed)
Pt with 4-5 day hx of cough  Smokes 1 PPD  Coughing up green expectorant

## 2017-10-30 ENCOUNTER — Emergency Department (HOSPITAL_COMMUNITY): Payer: 59

## 2017-10-30 ENCOUNTER — Emergency Department (HOSPITAL_COMMUNITY)
Admission: EM | Admit: 2017-10-30 | Discharge: 2017-10-30 | Disposition: A | Discharge status: Home / Self Care | Payer: 59 | Attending: Emergency Medicine | Admitting: Emergency Medicine

## 2017-10-30 ENCOUNTER — Encounter (HOSPITAL_COMMUNITY): Payer: Self-pay | Admitting: Emergency Medicine

## 2017-10-30 ENCOUNTER — Other Ambulatory Visit: Payer: Self-pay

## 2017-10-30 DIAGNOSIS — F17210 Nicotine dependence, cigarettes, uncomplicated: Secondary | ICD-10-CM | POA: Insufficient documentation

## 2017-10-30 DIAGNOSIS — Z79899 Other long term (current) drug therapy: Secondary | ICD-10-CM | POA: Insufficient documentation

## 2017-10-30 DIAGNOSIS — M79672 Pain in left foot: Secondary | ICD-10-CM | POA: Diagnosis not present

## 2017-10-30 MED ORDER — NAPROXEN 250 MG PO TABS: 250.0000 mg | ORAL_TABLET | Freq: Two times a day (BID) | ORAL | 0 refills | Status: DC | PRN

## 2017-10-30 NOTE — ED Triage Notes (Signed)
Pt c/o pain to top of LT foot after falling down 3 stairs this morning.

## 2017-10-30 NOTE — ED Provider Notes (Signed)
[facility] Provider Note   CSN: [id number] Arrival date & time: 10/30/17  1016     History   Chief Complaint Chief Complaint  Patient presents with  . Foot Injury    HPI Sheron NightingaleSandra Morrison is a 37 y.o. female.  HPI  Pt was seen at 1110. Per pt, c/o gradual onset and persistence of constant left foot "pain" that began this morning. Pt states she tripped down 2 steps this morning and injured the top of her foot. Pain worsens with palpation of her dorsal foot and weight bearing. Pt has been ambulatory since the injury. Denies any other injuries. Denies focal motor weakness, no tingling/numbness in extremities, no open wounds, no neck or back pain.   Past Medical History:  Diagnosis Date  . Thyroid disease    hypthyroid    There are no active problems to display for this patient.   Past Surgical History:  Procedure Laterality Date  . ANKLE SURGERY Left   . CARPAL TUNNEL RELEASE Right   . CESAREAN SECTION    . uterine ablasion      OB History    Gravida Para Term Preterm AB Living   1 1 1     1    SAB TAB Ectopic Multiple Live Births                   Home Medications    Prior to Admission medications   Medication Sig Start Date End Date Taking? Authorizing Provider  acetaminophen (TYLENOL) 500 MG tablet Take 1,000 mg by mouth every 6 (six) hours as needed for moderate pain.    [provider]  albuterol (PROVENTIL HFA;VENTOLIN HFA) 108 (90 Base) MCG/ACT inhaler Inhale 1-2 puffs into the lungs every 6 (six) hours as needed for wheezing or shortness of breath. 07/28/17   Elson AreasSofia, Leslie K, PA-C  azithromycin (ZITHROMAX) 250 MG tablet Take 1 tablet (250 mg total) by mouth daily. Take first 2 tablets together, then 1 every day until finished. 07/28/17   Elson AreasSofia, Leslie K, PA-C  buPROPion (WELLBUTRIN SR) 150 MG 12 hr tablet Take 150 mg by mouth daily. 11/10/14   [provider]  ibuprofen (ADVIL,MOTRIN) 200 MG tablet Take 400 mg by mouth every  6 (six) hours as needed for headache, mild pain or moderate pain.    [provider]  levothyroxine (SYNTHROID, LEVOTHROID) 125 MCG tablet Take 1 tablet (125 mcg total) by mouth daily before breakfast. 06/23/17   Pricilla LovelessGoldston, Scott, MD  omeprazole (PRILOSEC OTC) 20 MG tablet Take 20 mg by mouth every other day.    [provider]    Family History No family history on file.  Social History Social History   Tobacco Use  . Smoking status: Current Every Day Smoker    Packs/day: 1.00    Types: Cigarettes  . Smokeless tobacco: Never Used  Substance Use Topics  . Alcohol use: No  . Drug use: No     Allergies   Penicillins   Review of Systems Review of Systems ROS: Statement: All systems negative except as marked or noted in the HPI; Constitutional: Negative for fever and chills. ; ; Eyes: Negative for eye pain, redness and discharge. ; ; ENMT: Negative for ear pain, hoarseness, nasal congestion, sinus pressure and sore throat. ; ; Cardiovascular: Negative for chest pain, palpitations, diaphoresis, dyspnea and peripheral edema. ; ; Respiratory: Negative for cough, wheezing and stridor. ; ; Gastrointestinal: Negative for nausea, vomiting, diarrhea, abdominal pain, blood in  stool, hematemesis, jaundice and rectal bleeding. . ; ; Genitourinary: Negative for dysuria, flank pain and hematuria. ; ; Musculoskeletal: Negative for back pain and neck pain. +left foot pain..; ; Skin: Negative for pruritus, rash, abrasions, blisters, bruising and skin lesion.; ; Neuro: Negative for headache, lightheadedness and neck stiffness. Negative for weakness, altered level of consciousness, altered mental status, extremity weakness, paresthesias, involuntary movement, seizure and syncope.       Physical Exam Updated Vital Signs BP (!) 150/73 (BP Location: Left Arm) Comment: Simultaneous filing. User may not have seen previous data.  Pulse 91 Comment: Simultaneous filing. User may not have seen  previous data.  Temp 97.8 F (36.6 C) (Oral)   Resp 18   Ht 5\' 6"  (1.676 m)   Wt 136.1 kg (300 lb)   SpO2 97% Comment: Simultaneous filing. User may not have seen previous data.  BMI 48.42 kg/m   Physical Exam 1115: Physical examination:  Nursing notes reviewed; Vital signs and O2 SAT reviewed;  Constitutional: Well developed, Well nourished, Well hydrated, In no acute distress; Head:  Normocephalic, atraumatic; Eyes: EOMI, PERRL, No scleral icterus; ENMT: Mouth and pharynx normal, Mucous membranes moist; Neck: Supple, Full range of motion, No lymphadenopathy; Cardiovascular: Regular rate and rhythm, No gallop; Respiratory: Breath sounds clear & equal bilaterally, No wheezes.  Speaking full sentences with ease, Normal respiratory effort/excursion; Chest: Nontender, Movement normal; Abdomen: Soft, Nontender, Nondistended, Normal bowel sounds; Genitourinary: No CVA tenderness; Extremities: Pulses normal, NT left hip/knee/ankle/toes. +left dorsal foot metatarsal area tenderness to palp, no edema, no open wounds, no ecchymosis, no erythema, no deformity. NMS intact left foot, strong pedal pp, LE muscle compartments soft.  No left proximal fibular head tenderness, no knee tenderness. +plantarflexion of left foot w/calf squeeze.  No palpable gap left Achilles's tendon. No calf edema or asymmetry.; Neuro: AA&Ox3, Major CN grossly intact.  Speech clear. No gross focal motor or sensory deficits in extremities.; Skin: Color normal, Warm, Dry.   ED Treatments / Results  Labs (all labs ordered are listed, but only abnormal results are displayed)   EKG  EKG Interpretation None       Radiology   Procedures Procedures (including critical care time)  Medications Ordered in ED Medications - No data to display   Initial Impression / Assessment and Plan / ED Course  I have reviewed the triage vital signs and the nursing notes.  Pertinent labs & imaging results that were available during my care  of the patient were reviewed by me and considered in my medical decision making (see chart for details).  MDM Reviewed: previous chart, nursing note and vitals Interpretation: x-ray    Dg Foot Complete Left Result Date: 10/30/2017 CLINICAL DATA:  Dorsal forefoot pain after fall. EXAM: LEFT FOOT - COMPLETE 3+ VIEW COMPARISON:  None. FINDINGS: There is no evidence of fracture or dislocation. There is no evidence of arthropathy or other focal bone abnormality. Small Achilles enthesophyte. Soft tissues are unremarkable. IMPRESSION: Negative. Electronically Signed   By: Obie Dredge M.D.   On: 10/30/2017 11:04     1230:  XR reassuring. Pt c/o increasing pain with walking in her slipper. Cam walker applied for comfort. Dx and testing d/w pt.  Questions answered.  Verb understanding, agreeable to d/c home with outpt f/u.    Final Clinical Impressions(s) / ED Diagnoses   Final diagnoses:  None    ED Discharge Orders    None       Samuel Jester, DO 10/31/17 2208

## 2017-10-30 NOTE — Discharge Instructions (Signed)
Take the prescription as directed.  Also take over the counter tylenol, as directed on packaging, as needed for discomfort. Apply moist heat or ice to the area(s) of discomfort, for 15 minutes at a time, several times per day for the next few days.  Do not fall asleep on a heating or ice pack.  Wear the cam walker for comfort and support until you are seen in follow up. Call the Orthopedist today to schedule a follow up appointment within the next week.  Return to the Emergency Department immediately if worsening.

## 2018-04-27 ENCOUNTER — Emergency Department (HOSPITAL_COMMUNITY)
Admission: EM | Admit: 2018-04-27 | Discharge: 2018-04-28 | Disposition: A | Discharge status: Home / Self Care | Payer: 59 | Attending: Emergency Medicine | Admitting: Emergency Medicine

## 2018-04-27 ENCOUNTER — Emergency Department (HOSPITAL_COMMUNITY): Payer: 59

## 2018-04-27 ENCOUNTER — Encounter (HOSPITAL_COMMUNITY): Payer: Self-pay | Admitting: *Deleted

## 2018-04-27 ENCOUNTER — Other Ambulatory Visit: Payer: Self-pay

## 2018-04-27 DIAGNOSIS — E039 Hypothyroidism, unspecified: Secondary | ICD-10-CM | POA: Insufficient documentation

## 2018-04-27 DIAGNOSIS — F17210 Nicotine dependence, cigarettes, uncomplicated: Secondary | ICD-10-CM | POA: Diagnosis not present

## 2018-04-27 DIAGNOSIS — R1084 Generalized abdominal pain: Secondary | ICD-10-CM | POA: Diagnosis present

## 2018-04-27 DIAGNOSIS — Z79899 Other long term (current) drug therapy: Secondary | ICD-10-CM | POA: Diagnosis not present

## 2018-04-27 LAB — URINALYSIS, ROUTINE W REFLEX MICROSCOPIC
Bacteria, UA: NONE SEEN
Bilirubin Urine: NEGATIVE
GLUCOSE, UA: NEGATIVE mg/dL
Ketones, ur: NEGATIVE mg/dL
NITRITE: NEGATIVE
Protein, ur: NEGATIVE mg/dL
Specific Gravity, Urine: 1.034 — ABNORMAL HIGH (ref 1.005–1.030)
pH: 7 (ref 5.0–8.0)

## 2018-04-27 LAB — COMPREHENSIVE METABOLIC PANEL
ALK PHOS: 78 U/L (ref 38–126)
ALT: 25 U/L (ref 0–44)
ANION GAP: 5 (ref 5–15)
AST: 21 U/L (ref 15–41)
Albumin: 3.3 g/dL — ABNORMAL LOW (ref 3.5–5.0)
BILIRUBIN TOTAL: 0.6 mg/dL (ref 0.3–1.2)
BUN: 13 mg/dL (ref 6–20)
CALCIUM: 9.1 mg/dL (ref 8.9–10.3)
CO2: 25 mmol/L (ref 22–32)
Chloride: 108 mmol/L (ref 98–111)
Creatinine, Ser: 0.72 mg/dL (ref 0.44–1.00)
GLUCOSE: 92 mg/dL (ref 70–99)
Potassium: 4.5 mmol/L (ref 3.5–5.1)
Sodium: 138 mmol/L (ref 135–145)
TOTAL PROTEIN: 5.9 g/dL — AB (ref 6.5–8.1)

## 2018-04-27 LAB — CBC WITH DIFFERENTIAL/PLATELET
BASOS ABS: 0 10*3/uL (ref 0.0–0.1)
BASOS PCT: 0 %
EOS ABS: 0.2 10*3/uL (ref 0.0–0.7)
Eosinophils Relative: 2 %
HCT: 46 % (ref 36.0–46.0)
HEMOGLOBIN: 15.8 g/dL — AB (ref 12.0–15.0)
Lymphocytes Relative: 26 %
Lymphs Abs: 3.7 10*3/uL (ref 0.7–4.0)
MCH: 31.5 pg (ref 26.0–34.0)
MCHC: 34.3 g/dL (ref 30.0–36.0)
MCV: 91.6 fL (ref 78.0–100.0)
MONOS PCT: 8 %
Monocytes Absolute: 1.2 10*3/uL — ABNORMAL HIGH (ref 0.1–1.0)
Neutro Abs: 9.4 10*3/uL — ABNORMAL HIGH (ref 1.7–7.7)
Neutrophils Relative %: 64 %
Platelets: 260 10*3/uL (ref 150–400)
RBC: 5.02 MIL/uL (ref 3.87–5.11)
RDW: 13.1 % (ref 11.5–15.5)
WBC: 14.5 10*3/uL — ABNORMAL HIGH (ref 4.0–10.5)

## 2018-04-27 LAB — I-STAT BETA HCG BLOOD, ED (MC, WL, AP ONLY): I-stat hCG, quantitative: <5 m[IU]/mL (ref <5)

## 2018-04-27 LAB — LIPASE, BLOOD: LIPASE: 25 U/L (ref 11–51)

## 2018-04-27 MED ORDER — LACTATED RINGERS IV BOLUS
1000.0000 mL | Freq: Once | INTRAVENOUS | Status: AC
  Administered 2018-04-27: 1000 mL via INTRAVENOUS

## 2018-04-27 MED ORDER — POLYETHYLENE GLYCOL 3350 17 G PO PACK: 17.0000 g | PACK | Freq: Every day | ORAL | 0 refills | Status: AC

## 2018-04-27 MED ORDER — IOPAMIDOL (ISOVUE-300) INJECTION 61%
100.0000 mL | Freq: Once | INTRAVENOUS | Status: AC | PRN
  Administered 2018-04-27: 100 mL via INTRAVENOUS

## 2018-04-27 NOTE — ED Triage Notes (Signed)
Pt states that she has been constipated since Friday, tried drinking water to help produce a bowel movement without much improvement, states that she had small amount last night with bright red bleeding, continues to have bleeding today with abd pain,

## 2018-04-27 NOTE — ED Provider Notes (Signed)
[facility] EMERGENCY DEPARTMENT Provider Note   CSN: [id number] Arrival date & time: 04/27/18  1958     History   Chief Complaint Chief Complaint  Patient presents with  . Abdominal Pain    HPI Ann Morrison is a 37 y.o. female.  The history is provided by the patient.  Abdominal Pain   This is a new problem. The current episode started more than 2 days ago. The problem occurs daily. The problem has not changed since onset.Associated with: Patient with hemorrhoids and constipation. Has not had normal BM in several days. Has noticed increased blood on toilet paper.  The pain is located in the generalized abdominal region. The quality of the pain is cramping and dull. The pain is at a severity of 3/10. The pain is mild. Associated symptoms include constipation. Pertinent negatives include anorexia, fever, belching, diarrhea, flatus, hematochezia, melena, nausea, vomiting, dysuria, frequency, hematuria, headaches, arthralgias and myalgias. Nothing aggravates the symptoms. Nothing relieves the symptoms. Past workup does not include GI consult. Her past medical history does not include GERD.    Past Medical History:  Diagnosis Date  . Thyroid disease    hypthyroid    There are no active problems to display for this patient.   Past Surgical History:  Procedure Laterality Date  . ANKLE SURGERY Left   . CARPAL TUNNEL RELEASE Right   . CESAREAN SECTION    . uterine ablasion       OB History    Gravida  1   Para  1   Term  1   Preterm      AB      Living  1     SAB      TAB      Ectopic      Multiple      Live Births               Home Medications    Prior to Admission medications   Medication Sig Start Date End Date Taking? Authorizing Provider  cephALEXin (KEFLEX) 500 MG capsule Take 500 mg by mouth 3 (three) times daily. 7 day course starting on 04/11/2018 04/11/18  Yes [provider]  ibuprofen (ADVIL,MOTRIN) 800 MG tablet Take 800 mg by  mouth every 8 (eight) hours as needed. 04/09/18  Yes [provider]  levonorgestrel (MIRENA) 20 MCG/24HR IUD 1 each by Intrauterine route once.   Yes [provider]  levothyroxine (SYNTHROID, LEVOTHROID) 125 MCG tablet Take 1 tablet (125 mcg total) by mouth daily before breakfast. 06/23/17  Yes Pricilla Loveless, MD  pantoprazole (PROTONIX) 40 MG tablet Take 40 mg by mouth every morning. 02/04/18  Yes [provider]  phentermine (ADIPEX-P) 37.5 MG tablet Take 37.5 mg by mouth daily. 02/04/18  Yes [provider]  polyethylene glycol (MIRALAX / GLYCOLAX) packet Take 17 g by mouth daily for 14 days. 04/27/18 05/11/18  Virgina Norfolk, DO    Family History No family history on file.  Social History Social History   Tobacco Use  . Smoking status: Current Every Day Smoker    Packs/day: 1.00    Types: Cigarettes  . Smokeless tobacco: Never Used  Substance Use Topics  . Alcohol use: No  . Drug use: No     Allergies   Amoxicillin and Penicillins   Review of Systems Review of Systems  Constitutional: Negative for chills and fever.  HENT: Negative for ear pain and sore throat.   Eyes: Negative for pain and  visual disturbance.  Respiratory: Negative for cough and shortness of breath.   Cardiovascular: Negative for chest pain and palpitations.  Gastrointestinal: Positive for abdominal pain and constipation. Negative for anorexia, diarrhea, flatus, hematochezia, melena, nausea and vomiting.  Genitourinary: Negative for dysuria, frequency and hematuria.  Musculoskeletal: Negative for arthralgias, back pain and myalgias.  Skin: Negative for color change and rash.  Neurological: Negative for seizures, syncope and headaches.  All other systems reviewed and are negative.    Physical Exam Updated Vital Signs  ED Triage Vitals  Enc Vitals Group     BP 04/27/18 2001 123/70     Pulse Rate 04/27/18 2001 91     Resp 04/27/18 2001 19     Temp 04/27/18 2001  98.3 F (36.8 C)     Temp Source 04/27/18 2001 Oral     SpO2 04/27/18 2001 99 %     Weight 04/27/18 2002 300 lb (136.1 kg)     Height 04/27/18 2002 5\' 6"  (1.676 m)     Head Circumference --      Peak Flow --      Pain Score 04/27/18 2002 7     Pain Loc --      Pain Edu? --      Excl. in GC? --     Physical Exam  Constitutional: She appears well-developed and well-nourished. No distress.  HENT:  Head: Normocephalic and atraumatic.  Mouth/Throat: Oropharynx is clear and moist. No oropharyngeal exudate.  Eyes: Pupils are equal, round, and reactive to light. Conjunctivae and EOM are normal.  Neck: Neck supple.  Cardiovascular: Normal rate, regular rhythm, normal heart sounds and intact distal pulses.  No murmur heard. Pulmonary/Chest: Effort normal and breath sounds normal. No respiratory distress.  Abdominal: Soft. Normal appearance and bowel sounds are normal. There is generalized tenderness. There is no rigidity, no rebound, no CVA tenderness, no tenderness at McBurney's point and negative Murphy's sign. No hernia.  Genitourinary: Rectal exam shows no tenderness.  Genitourinary Comments: Several small hemorrhoids   Musculoskeletal: She exhibits no edema.  Neurological: She is alert.  Skin: Skin is warm and dry.  Psychiatric: She has a normal mood and affect.  Nursing note and vitals reviewed.    ED Treatments / Results  Labs (all labs ordered are listed, but only abnormal results are displayed) Labs Reviewed  COMPREHENSIVE METABOLIC PANEL - Abnormal; Notable for the following components:      Result Value   Total Protein 5.9 (*)    Albumin 3.3 (*)    All other components within normal limits  CBC WITH DIFFERENTIAL/PLATELET - Abnormal; Notable for the following components:   WBC 14.5 (*)    Hemoglobin 15.8 (*)    Neutro Abs 9.4 (*)    Monocytes Absolute 1.2 (*)    All other components within normal limits  URINALYSIS, ROUTINE W REFLEX MICROSCOPIC - Abnormal; Notable  for the following components:   Color, Urine STRAW (*)    Specific Gravity, Urine 1.034 (*)    Hgb urine dipstick SMALL (*)    Leukocytes, UA MODERATE (*)    All other components within normal limits  LIPASE, BLOOD  I-STAT BETA HCG BLOOD, ED ([facility], [facility], [facility] ONLY)    EKG None  Radiology Ct Abdomen Pelvis W Contrast  Result Date: 04/27/2018 CLINICAL DATA:  37 year old female with abdominal distention. EXAM: CT ABDOMEN AND PELVIS WITH CONTRAST TECHNIQUE: Multidetector CT imaging of the abdomen and pelvis was performed using the standard protocol following bolus administration  of intravenous contrast. CONTRAST:  ISOVUE-300 IOPAMIDOL (ISOVUE-300) INJECTION 61% COMPARISON:  Abdominal CT dated 02/14/2015 FINDINGS: Lower chest: Minimal right lung base atelectatic changes. The visualized lung bases are otherwise clear. No intra-abdominal free air or free fluid. Hepatobiliary: Apparent fatty infiltration of the liver. No intrahepatic biliary ductal dilatation a subcentimeter hypodense lesion in the dome of the liver is not characterized. No calcified gallstone. Pancreas: Unremarkable. No pancreatic ductal dilatation or surrounding inflammatory changes. Spleen: Normal in size without focal abnormality. Adrenals/Urinary Tract: Adrenal glands are unremarkable. Kidneys are normal, without renal calculi, focal lesion, or hydronephrosis. Bladder is unremarkable. Stomach/Bowel: Stomach is within normal limits. Appendix appears normal. No evidence of bowel wall thickening, distention, or inflammatory changes. Vascular/Lymphatic: No significant vascular findings are present. No enlarged abdominal or pelvic lymph nodes. Reproductive: The uterus is anteverted. An intrauterine device is noted. Evaluation of the IUD position is limited on CT. However, the IUD appears in appropriate positioning. The ovaries are somewhat prominent which may be related to menstrual cycle or represent polycystic morphology. Ultrasound may  provide better evaluation of the pelvic structures if clinically indicated. Other: None Musculoskeletal: No acute or significant osseous findings. IMPRESSION: 1. No acute intra-abdominopelvic pathology. No bowel obstruction or active inflammation. Normal appendix. 2. Mild fatty liver. Electronically Signed   By: Elgie Collard M.D.   On: 04/27/2018 22:06    Procedures Procedures (including critical care time)  Medications Ordered in ED Medications  lactated ringers bolus 1,000 mL (0 mLs Intravenous Stopped 04/27/18 2216)  iopamidol (ISOVUE-300) 61 % injection 100 mL (100 mLs Intravenous Contrast Given 04/27/18 2137)     Initial Impression / Assessment and Plan / ED Course  I have reviewed the triage vital signs and the nursing notes.  Pertinent labs & imaging results that were available during my care of the patient were reviewed by me and considered in my medical decision making (see chart for details).     Ann Morrison is a 37 year old female with no significant medical history who presents to the ED with abdominal pain, hemorrhoids.  Patient with normal vitals.  No fever.  Patient with diffuse abdominal pain for last several days, has had some bleeding from known hemorrhoids, concern for constipation.  Has had no nausea or vomiting.  Patient states that she has been passing gas.  Patient denies any fevers or chills.  No vaginal symptoms, no urinary symptoms.  Patient with diffuse tenderness on abdominal exam but no signs of peritonitis.  Rectal exam overall unremarkable except for several small hemorrhoids.  No signs of bleeding.  Lab work showed no significant anemia, electrolyte abnormality, kidney injury.  Lipase within normal limits doubt pancreatitis. Mild leukocytosis. Patient with CT scan of the abdomen and pelvis did not show any acute findings including no appendicitis, no obstruction.  Urinalysis unremarkable.  Patient likely with some constipation causing her discomfort.  Appears to  be making hemorrhoids worse and will give prescription for MiraLAX.  Patient was discharged from ED in good condition and told to return to the ED symptoms worsen.  This chart was dictated using voice recognition software.  Despite best efforts to proofread,  errors can occur which can change the documentation meaning.   Final Clinical Impressions(s) / ED Diagnoses   Final diagnoses:  Generalized abdominal pain    ED Discharge Orders         Ordered    polyethylene glycol (MIRALAX / GLYCOLAX) packet  Daily     04/27/18 2348  Virgina Norfolk, DO 04/27/18 2355

## 2018-04-27 NOTE — ED Notes (Signed)
Patient transported to CT 

## 2019-03-17 ENCOUNTER — Emergency Department (HOSPITAL_COMMUNITY)
Admission: EM | Admit: 2019-03-17 | Discharge: 2019-03-17 | Disposition: A | Discharge status: Home / Self Care | Payer: Self-pay | Attending: Emergency Medicine | Admitting: Emergency Medicine

## 2019-03-17 ENCOUNTER — Other Ambulatory Visit: Payer: Self-pay

## 2019-03-17 ENCOUNTER — Emergency Department (HOSPITAL_COMMUNITY): Payer: Self-pay

## 2019-03-17 DIAGNOSIS — Z20828 Contact with and (suspected) exposure to other viral communicable diseases: Secondary | ICD-10-CM | POA: Insufficient documentation

## 2019-03-17 DIAGNOSIS — E039 Hypothyroidism, unspecified: Secondary | ICD-10-CM | POA: Insufficient documentation

## 2019-03-17 DIAGNOSIS — J209 Acute bronchitis, unspecified: Secondary | ICD-10-CM | POA: Insufficient documentation

## 2019-03-17 DIAGNOSIS — F17210 Nicotine dependence, cigarettes, uncomplicated: Secondary | ICD-10-CM | POA: Insufficient documentation

## 2019-03-17 DIAGNOSIS — Z79899 Other long term (current) drug therapy: Secondary | ICD-10-CM | POA: Insufficient documentation

## 2019-03-17 LAB — CBC WITH DIFFERENTIAL/PLATELET
Abs Immature Granulocytes: 0.04 10*3/uL (ref 0.00–0.07)
Basophils Absolute: 0.1 10*3/uL (ref 0.0–0.1)
Basophils Relative: 1 %
Eosinophils Absolute: 0 10*3/uL (ref 0.0–0.5)
Eosinophils Relative: 0 %
HCT: 49.4 % — ABNORMAL HIGH (ref 36.0–46.0)
Hemoglobin: 16.4 g/dL — ABNORMAL HIGH (ref 12.0–15.0)
Immature Granulocytes: 0 %
Lymphocytes Relative: 19 %
Lymphs Abs: 2.7 10*3/uL (ref 0.7–4.0)
MCH: 30.4 pg (ref 26.0–34.0)
MCHC: 33.2 g/dL (ref 30.0–36.0)
MCV: 91.5 fL (ref 80.0–100.0)
Monocytes Absolute: 0.7 10*3/uL (ref 0.1–1.0)
Monocytes Relative: 5 %
Neutro Abs: 10.3 10*3/uL — ABNORMAL HIGH (ref 1.7–7.7)
Neutrophils Relative %: 75 %
Platelets: 300 10*3/uL (ref 150–400)
RBC: 5.4 MIL/uL — ABNORMAL HIGH (ref 3.87–5.11)
RDW: 12.5 % (ref 11.5–15.5)
WBC: 13.9 10*3/uL — ABNORMAL HIGH (ref 4.0–10.5)
nRBC: 0 % (ref 0.0–0.2)

## 2019-03-17 LAB — BASIC METABOLIC PANEL
Anion gap: 8 (ref 5–15)
BUN: 9 mg/dL (ref 6–20)
CO2: 23 mmol/L (ref 22–32)
Calcium: 10 mg/dL (ref 8.9–10.3)
Chloride: 105 mmol/L (ref 98–111)
Creatinine, Ser: 0.74 mg/dL (ref 0.44–1.00)
GFR calc Af Amer: >60 mL/min (ref >60)
GFR calc non Af Amer: >60 mL/min (ref >60)
Glucose, Bld: 131 mg/dL — ABNORMAL HIGH (ref 70–99)
Potassium: 3.8 mmol/L (ref 3.5–5.1)
Sodium: 136 mmol/L (ref 135–145)

## 2019-03-17 LAB — TROPONIN I (HIGH SENSITIVITY): Troponin I (High Sensitivity): <2 ng/L (ref <18)

## 2019-03-17 MED ORDER — ALBUTEROL SULFATE HFA 108 (90 BASE) MCG/ACT IN AERS
2.0000 | INHALATION_SPRAY | Freq: Once | RESPIRATORY_TRACT | Status: AC
  Administered 2019-03-17: 2 via RESPIRATORY_TRACT
  Filled 2019-03-17: qty 6.7

## 2019-03-17 MED ORDER — AZITHROMYCIN 250 MG PO TABS
500.0000 mg | ORAL_TABLET | Freq: Once | ORAL | Status: AC
  Administered 2019-03-17: 500 mg via ORAL
  Filled 2019-03-17: qty 2

## 2019-03-17 MED ORDER — AZITHROMYCIN 250 MG PO TABS: ORAL_TABLET | ORAL | 0 refills | Status: AC

## 2019-03-17 NOTE — ED Notes (Signed)
Pt ambulated without difficulty. O2 sats between 98-99%

## 2019-03-17 NOTE — ED Triage Notes (Signed)
Sob, congestion, right cp and cough productive since yesterday afternoon.  Denies fevers.

## 2019-03-17 NOTE — ED Notes (Signed)
ekg handed to Dr. Pickering 

## 2019-03-17 NOTE — Discharge Instructions (Addendum)
I suspect your symptoms are from acute bronchitis which is usually a viral respiratory infection.  However as you know you have been screened for COVID-19 while here.  It should result within 24 to 48 hours.  You need to maintain home quarantine, and try to avoid close contact with family members until the results of this test are known.  Rest to make sure you are drinking plenty of fluids.  You may use the albuterol inhaler taking 2 puffs every 4 hours if you are wheezing or shortness of breath returns.  Take your next dose of Zithromax tomorrow evening.  Get rechecked for any worsening or persistent symptoms.  You will be required to continue your quarantine status for total of 14 days from today if your culture test is positive.     Person Under Monitoring Name: Ann NightingaleSandra Morrison  Location: [address] [place] [address]   Infection Prevention Recommendations for Individuals Confirmed to have, or Being Evaluated for, 2019 Novel Coronavirus (COVID-19) Infection Who Receive Care at Home  Individuals who are confirmed to have, or are being evaluated for, COVID-19 should follow the prevention steps below until a healthcare provider or local or state health department says they can return to normal activities.  Stay home except to get medical care You should restrict activities outside your home, except for getting medical care. Do not go to work, school, or public areas, and do not use public transportation or taxis.  Call ahead before visiting your doctor Before your medical appointment, call the healthcare provider and tell them that you have, or are being evaluated for, COVID-19 infection. This will help the healthcare providers office take steps to keep other people from getting infected. Ask your healthcare provider to call the local or state health department.  Monitor your symptoms Seek prompt medical attention if your illness is worsening (e.g., difficulty breathing). Before going to  your medical appointment, call the healthcare provider and tell them that you have, or are being evaluated for, COVID-19 infection. Ask your healthcare provider to call the local or state health department.  Wear a facemask You should wear a facemask that covers your nose and mouth when you are in the same room with other people and when you visit a healthcare provider. People who live with or visit you should also wear a facemask while they are in the same room with you.  Separate yourself from other people in your home As much as possible, you should stay in a different room from other people in your home. Also, you should use a separate bathroom, if available.  Avoid sharing household items You should not share dishes, drinking glasses, cups, eating utensils, towels, bedding, or other items with other people in your home. After using these items, you should wash them thoroughly with soap and water.  Cover your coughs and sneezes Cover your mouth and nose with a tissue when you cough or sneeze, or you can cough or sneeze into your sleeve. Throw used tissues in a lined trash can, and immediately wash your hands with soap and water for at least 20 seconds or use an alcohol-based hand rub.  Wash your [employer]hands Wash your hands often and thoroughly with soap and water for at least 20 seconds. You can use an alcohol-based hand sanitizer if soap and water are not available and if your hands are not visibly dirty. Avoid touching your eyes, nose, and mouth with unwashed hands.   Prevention Steps for Caregivers and Household  Members of Individuals Confirmed to have, or Being Evaluated for, COVID-19 Infection Being Cared for in the Home  If you live with, or provide care at home for, a person confirmed to have, or being evaluated for, COVID-19 infection please follow these guidelines to prevent infection:  Follow healthcare providers instructions Make sure that you understand and can help the  patient follow any healthcare provider instructions for all care.  Provide for the patients basic needs You should help the patient with basic needs in the home and provide support for getting groceries, prescriptions, and other personal needs.  Monitor the patients symptoms If they are getting sicker, call his or her medical provider and tell them that the patient has, or is being evaluated for, COVID-19 infection. This will help the healthcare providers office take steps to keep other people from getting infected. Ask the healthcare provider to call the local or state health department.  Limit the number of people who have contact with the patient If possible, have only one caregiver for the patient. Other household members should stay in another home or place of residence. If this is not possible, they should stay in another room, or be separated from the patient as much as possible. Use a separate bathroom, if available. Restrict visitors who do not have an essential need to be in the home.  Keep older adults, very young children, and other sick people away from the patient Keep older adults, very young children, and those who have compromised immune systems or chronic health conditions away from the patient. This includes people with chronic heart, lung, or kidney conditions, diabetes, and cancer.  Ensure good ventilation Make sure that shared spaces in the home have good air flow, such as from an air conditioner or an opened window, weather permitting.  Wash your hands often Wash your hands often and thoroughly with soap and water for at least 20 seconds. You can use an alcohol based hand sanitizer if soap and water are not available and if your hands are not visibly dirty. Avoid touching your eyes, nose, and mouth with unwashed hands. Use disposable paper towels to dry your hands. If not available, use dedicated cloth towels and replace them when they become wet.  Wear a  facemask and gloves Wear a disposable facemask at all times in the room and gloves when you touch or have contact with the patients blood, body fluids, and/or secretions or excretions, such as sweat, saliva, sputum, nasal mucus, vomit, urine, or feces.  Ensure the mask fits over your nose and mouth tightly, and do not touch it during use. Throw out disposable facemasks and gloves after using them. Do not reuse. Wash your hands immediately after removing your facemask and gloves. If your personal clothing becomes contaminated, carefully remove clothing and launder. Wash your hands after handling contaminated clothing. Place all used disposable facemasks, gloves, and other waste in a lined container before disposing them with other household waste. Remove gloves and wash your hands immediately after handling these items.  Do not share dishes, glasses, or other household items with the patient Avoid sharing household items. You should not share dishes, drinking glasses, cups, eating utensils, towels, bedding, or other items with a patient who is confirmed to have, or being evaluated for, COVID-19 infection. After the person uses these items, you should wash them thoroughly with soap and water.  Wash laundry thoroughly Immediately remove and wash clothes or bedding that have blood, body fluids, and/or secretions or excretions,  such as sweat, saliva, sputum, nasal mucus, vomit, urine, or feces, on them. Wear gloves when handling laundry from the patient. Read and follow directions on labels of laundry or clothing items and detergent. In general, wash and dry with the warmest temperatures recommended on the label.  Clean all areas the individual has used often Clean all touchable surfaces, such as counters, tabletops, doorknobs, bathroom fixtures, toilets, phones, keyboards, tablets, and bedside tables, every day. Also, clean any surfaces that may have blood, body fluids, and/or secretions or excretions  on them. Wear gloves when cleaning surfaces the patient has come in contact with. Use a diluted bleach solution (e.g., dilute bleach with 1 part bleach and 10 parts water) or a household disinfectant with a label that says EPA-registered for coronaviruses. To make a bleach solution at home, add 1 tablespoon of bleach to 1 quart (4 cups) of water. For a larger supply, add  cup of bleach to 1 gallon (16 cups) of water. Read labels of cleaning products and follow recommendations provided on product labels. Labels contain instructions for safe and effective use of the cleaning product including precautions you should take when applying the product, such as wearing gloves or eye protection and making sure you have good ventilation during use of the product. Remove gloves and wash hands immediately after cleaning.  Monitor yourself for signs and symptoms of illness Caregivers and household members are considered close contacts, should monitor their health, and will be asked to limit movement outside of the home to the extent possible. Follow the monitoring steps for close contacts listed on the symptom monitoring form.   ? If you have additional questions, contact your local health department or call the epidemiologist on call at [phone] (available 24/7). ? This guidance is subject to change. For the most up-to-date guidance from [facility], please refer to their website: YouBlogs.pl

## 2019-03-17 NOTE — ED Provider Notes (Signed)
[facility] Provider Note   CSN: [id number] Arrival date & time: 03/17/19  1052    History   Chief Complaint Chief Complaint  Patient presents with  . Shortness of Breath    HPI Ann Morrison is a 38 y.o. female with a history of thyroid disease, hx of acute bronchitis who continues to smoke, presenting with cough, shortness of breath with upper midsternal chest pressure associated with intermittent wheezing, with sx starting yesterday evening.  She endorses productive cough, denies fevers or chills. Lives with husband and 5 yr old, no known contact with covid.  She denies weakness, n/v, nasal congestion or drainage, reports mild sore throat but started after the cough which she suspects is the reason for pharyngitis.  She denies palpitations, dizziness, reduced appetite, no loss of taste or smell. She has had no treatments prior to arrival.     The history is provided by the patient.    Past Medical History:  Diagnosis Date  . Thyroid disease    hypthyroid    There are no active problems to display for this patient.   Past Surgical History:  Procedure Laterality Date  . ANKLE SURGERY Left   . CARPAL TUNNEL RELEASE Right   . CESAREAN SECTION    . uterine ablasion       OB History    Gravida  1   Para  1   Term  1   Preterm      AB      Living  1     SAB      TAB      Ectopic      Multiple      Live Births               Home Medications    Prior to Admission medications   Medication Sig Start Date End Date Taking? Authorizing Provider  levonorgestrel (MIRENA) 20 MCG/24HR IUD 1 each by Intrauterine route once.   Yes [provider]  levothyroxine (SYNTHROID, LEVOTHROID) 125 MCG tablet Take 1 tablet (125 mcg total) by mouth daily before breakfast. 06/23/17  Yes Pricilla LovelessGoldston, Scott, MD  omeprazole (PRILOSEC) 20 MG capsule Take 20 mg by mouth daily.   Yes [provider]  azithromycin (ZITHROMAX Z-PAK) 250 MG  tablet Take one tablet daily starting 7/22 until completed 03/17/19   Burgess AmorIdol, Gregary Blackard, PA-C    Family History No family history on file.  Social History Social History   Tobacco Use  . Smoking status: Current Every Day Smoker    Packs/day: 1.00    Types: Cigarettes  . Smokeless tobacco: Never Used  Substance Use Topics  . Alcohol use: No  . Drug use: No     Allergies   Amoxicillin and Penicillins   Review of Systems Review of Systems  Constitutional: Negative for chills and fever.  HENT: Positive for sore throat. Negative for congestion, ear pain, rhinorrhea, sinus pressure, trouble swallowing and voice change.   Eyes: Negative for discharge.  Respiratory: Positive for cough, chest tightness, shortness of breath and wheezing. Negative for stridor.   Cardiovascular: Negative for chest pain, palpitations and leg swelling.  Gastrointestinal: Negative for abdominal pain, nausea and vomiting.  Genitourinary: Negative.      Physical Exam Updated Vital Signs BP 127/75   Pulse 91   Temp 98.2 F (36.8 C) (Oral)   Resp 15   Ht 5\' 6"  (1.676 m)   Wt 133.8 kg   LMP 02/25/2019  SpO2 97%   BMI 47.61 kg/m   Physical Exam Vitals signs and nursing note reviewed.  Constitutional:      Appearance: She is well-developed.  HENT:     Head: Normocephalic and atraumatic.     Mouth/Throat:     Pharynx: No pharyngeal swelling or oropharyngeal exudate.  Eyes:     Conjunctiva/sclera: Conjunctivae normal.  Neck:     Musculoskeletal: Normal range of motion.  Cardiovascular:     Rate and Rhythm: Regular rhythm. Tachycardia present.     Pulses: Normal pulses.     Heart sounds: Normal heart sounds.     Comments: Borderline tachycardia Pulmonary:     Effort: Pulmonary effort is normal.     Breath sounds: Normal breath sounds. No decreased breath sounds, wheezing or rhonchi.  Chest:     Chest wall: Tenderness present.     Comments: TTP upper sternum.  No crepitus, no edema.  Abdominal:     General: Bowel sounds are normal.     Palpations: Abdomen is soft.     Tenderness: There is no abdominal tenderness.  Musculoskeletal: Normal range of motion.  Lymphadenopathy:     Cervical: No cervical adenopathy.  Skin:    General: Skin is warm and dry.  Neurological:     Mental Status: She is alert.      ED Treatments / Results  Labs (all labs ordered are listed, but only abnormal results are displayed) Labs Reviewed  CBC WITH DIFFERENTIAL/PLATELET - Abnormal; Notable for the following components:      Result Value   WBC 13.9 (*)    RBC 5.40 (*)    Hemoglobin 16.4 (*)    HCT 49.4 (*)    Neutro Abs 10.3 (*)    All other components within normal limits  BASIC METABOLIC PANEL - Abnormal; Notable for the following components:   Glucose, Bld 131 (*)    All other components within normal limits  NOVEL CORONAVIRUS, NAA (HOSPITAL ORDER, SEND-OUT TO REF LAB)  TROPONIN I (HIGH SENSITIVITY)    EKG EKG Interpretation  Date/Time:  Tuesday March 17 2019 11:04:17 EDT Ventricular Rate:  110 PR Interval:  184 QRS Duration: 92 QT Interval:  326 QTC Calculation: 441 R Axis:   -36 Text Interpretation:  Sinus tachycardia Left axis deviation Abnormal ECG Confirmed by Nat Christen [phone]) on 03/17/2019 4:31:17 PM   Radiology Dg Chest 2 View  Result Date: 03/17/2019 CLINICAL DATA:  Chest pain.  Dry cough. EXAM: CHEST - 2 VIEW COMPARISON:  07/28/2017. FINDINGS: Mediastinum and hilar structures normal. Mild bibasilar atelectasis. No pleural effusion or pneumothorax. Heart size normal. IMPRESSION: Mild bibasilar subsegmental atelectasis. Electronically Signed   By: Marcello Moores  Register   On: 03/17/2019 11:48    Procedures Procedures (including critical care time)  Medications Ordered in ED Medications  albuterol (VENTOLIN HFA) 108 (90 Base) MCG/ACT inhaler 2 puff (2 puffs Inhalation Given 03/17/19 1713)  azithromycin (ZITHROMAX) tablet 500 mg (500 mg Oral Given 03/17/19  1713)     Initial Impression / Assessment and Plan / ED Course  I have reviewed the triage vital signs and the nursing notes.  Pertinent labs & imaging results that were available during my care of the patient were reviewed by me and considered in my medical decision making (see chart for details).        Patient with history and exam suggesting acute bronchitis.  She is borderline tachycardic while here, dry sounding cough.  She has no wheezing on exam, but  due to hx was given albuterol MDI with improvement in her symptoms, she ambulated without shortness of breath or tachypnea or hypoxia.  Doubt PE.  Imaging reviewed and discussed with patient.  She was placed on Zithromax given her smoking history.  She has had no known exposures to COVID 19, she was screened for this however and given instructions for home isolation pending these results.  Return precautions were discussed.    Ann Morrison was evaluated in Emergency Department on 03/17/2019 for the symptoms described in the history of present illness. She was evaluated in the context of the global COVID-19 pandemic, which necessitated consideration that the patient might be at risk for infection with the SARS-CoV-2 virus that causes COVID-19. Institutional protocols and algorithms that pertain to the evaluation of patients at risk for COVID-19 are in a state of rapid change based on information released by regulatory bodies including the CDC and federal and state organizations. These policies and algorithms were followed during the patient's care in the ED.   Final Clinical Impressions(s) / ED Diagnoses   Final diagnoses:  Acute bronchitis, unspecified organism    ED Discharge Orders         Ordered    azithromycin (ZITHROMAX Z-PAK) 250 MG tablet     03/17/19 1740           Burgess Amordol, Eidan Muellner, Cordelia Poche-C 03/17/19 1752    Donnetta Hutchingook, Brian, MD 03/18/19 1623

## 2019-03-18 LAB — NOVEL CORONAVIRUS, NAA (HOSP ORDER, SEND-OUT TO REF LAB; TAT 18-24 HRS): SARS-CoV-2, NAA: NOT DETECTED

## 2019-11-16 IMAGING — CT CT ABD-PELV W/ CM
2 of 4 series · 16 of 46 positions shown, 18 images · IV contrast (Isovue)
Comparison: Abdominal CT dated 02/14/2015

CLINICAL DATA: 36-year-old female with abdominal distention.

EXAM:
CT ABDOMEN AND PELVIS WITH CONTRAST
TECHNIQUE: Multidetector CT imaging of the abdomen and pelvis was performed
using the standard protocol following bolus administration of
intravenous contrast.
CONTRAST:  100mL CQSUZR-388 IOPAMIDOL (CQSUZR-388) INJECTION 61%

[Series 2: axial st · axial · 0.98mm/px · z∈[-541,-121]mm · 13 of 93 slices shown, 15 images]
[im 5/93  soft-tissue]
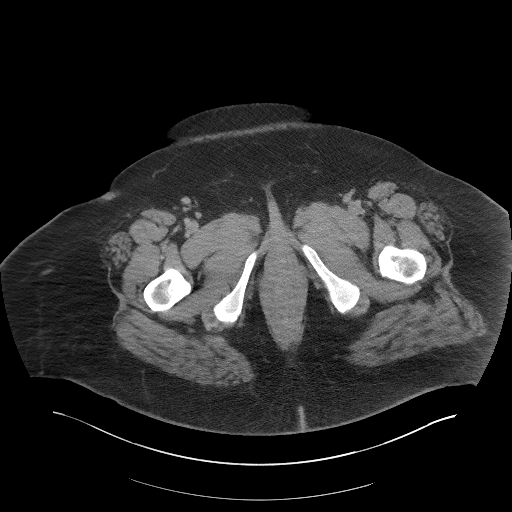
[im 5/93  bone]
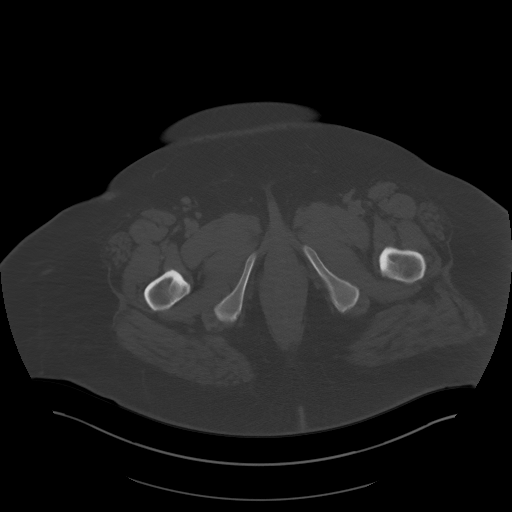
[im 13/93  soft-tissue]
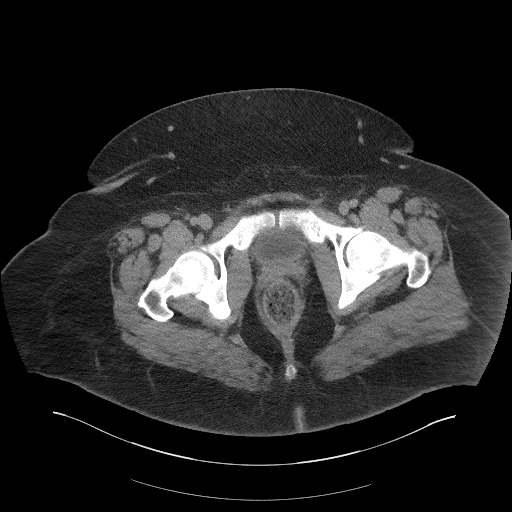
[im 21/93  soft-tissue]
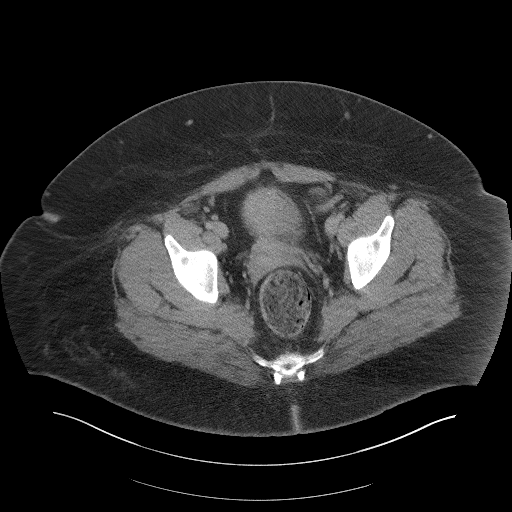
[im 25/93  soft-tissue]
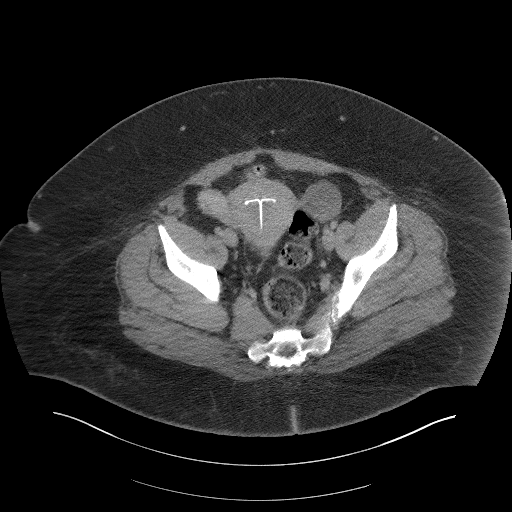
[im 33/93  soft-tissue]
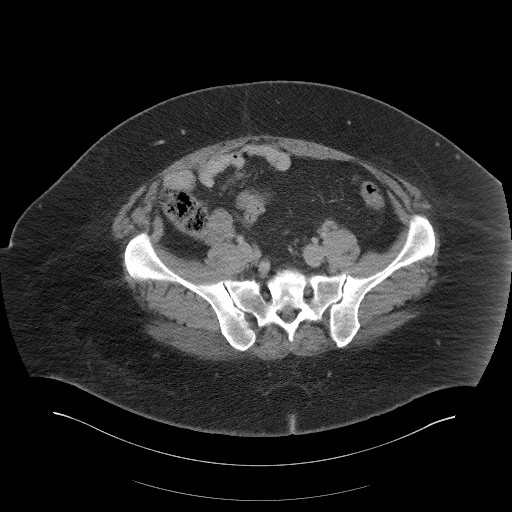
[im 41/93  soft-tissue]
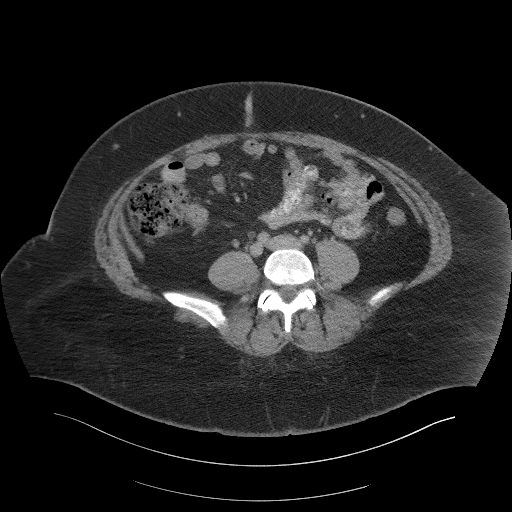
[im 49/93  soft-tissue]
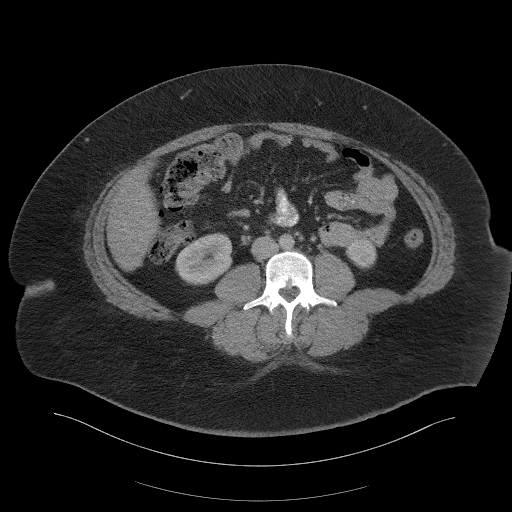
[im 53/93  soft-tissue]
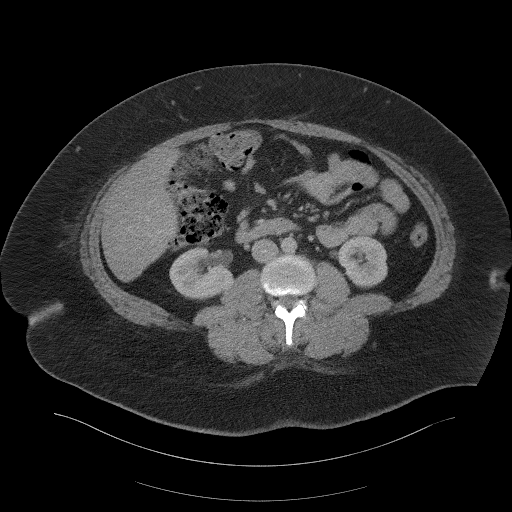
[im 61/93  soft-tissue]
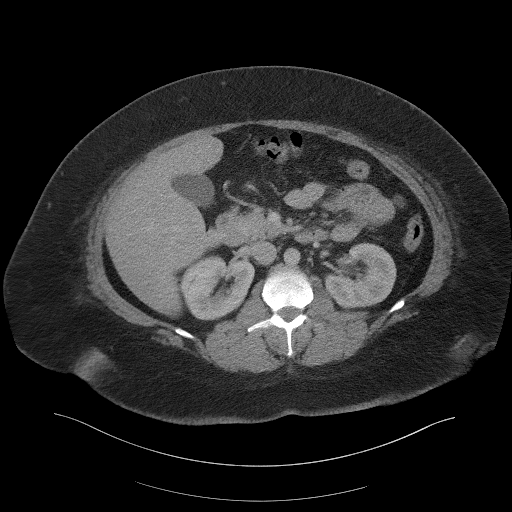
[im 61/93  bone]
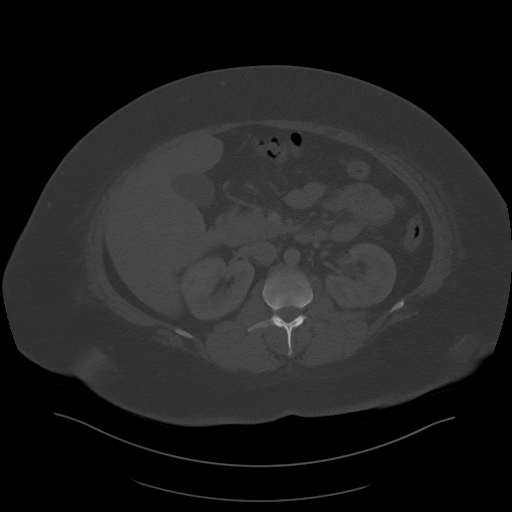
[im 69/93  soft-tissue]
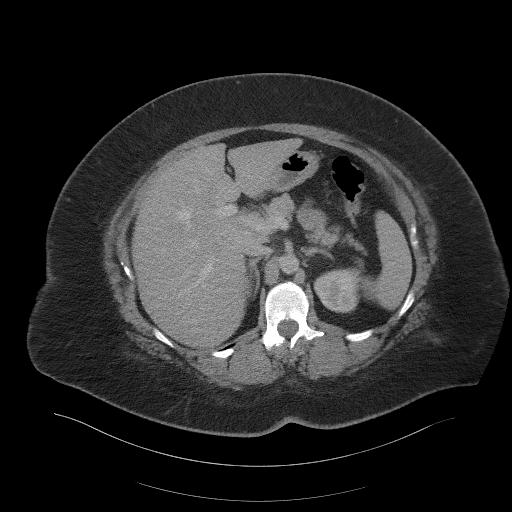
[im 73/93  soft-tissue]
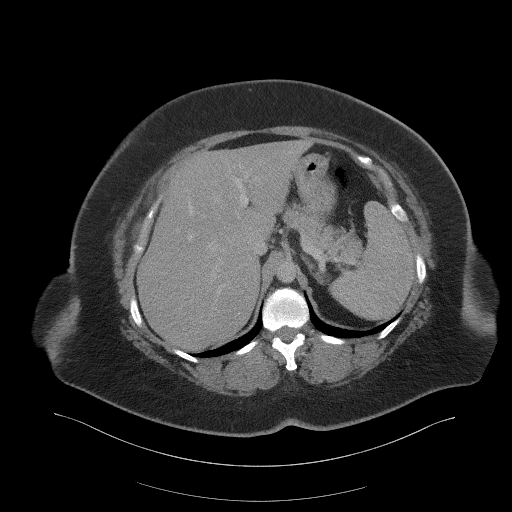
[im 81/93  soft-tissue]
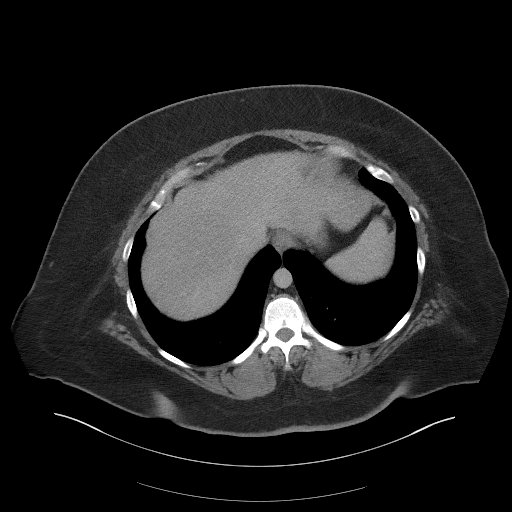
[im 89/93  soft-tissue]
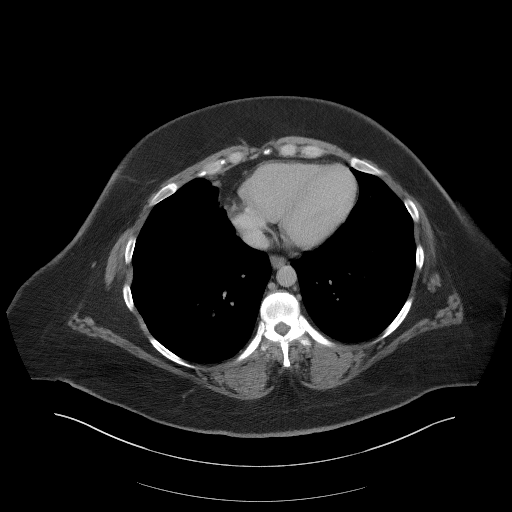

[Series 5: coronal st · coronal · 0.90mm/px · 3 of 139 slices shown]
[im 47/139  soft-tissue]
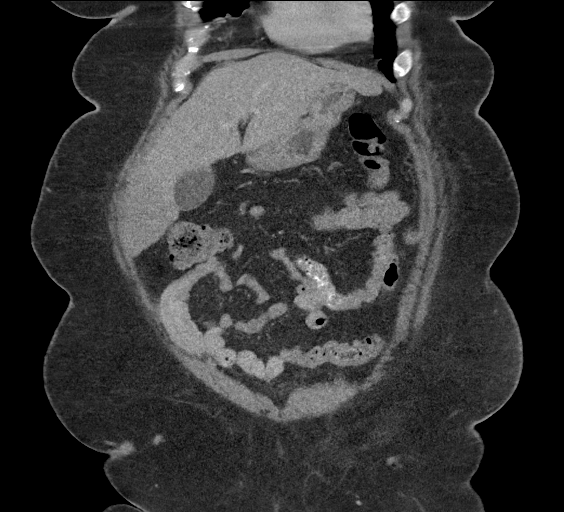
[im 62/139  soft-tissue]
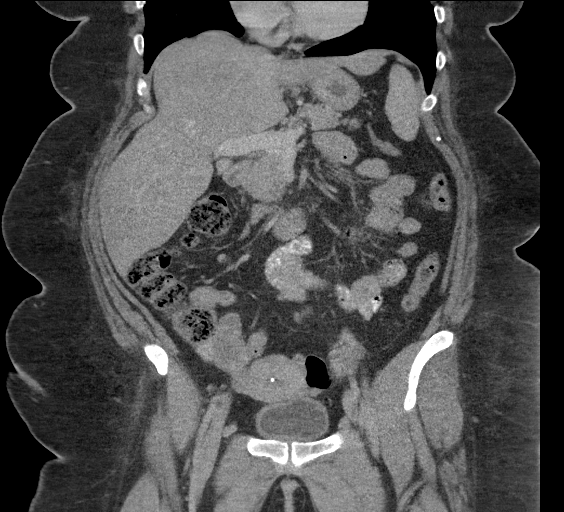
[im 77/139  soft-tissue]
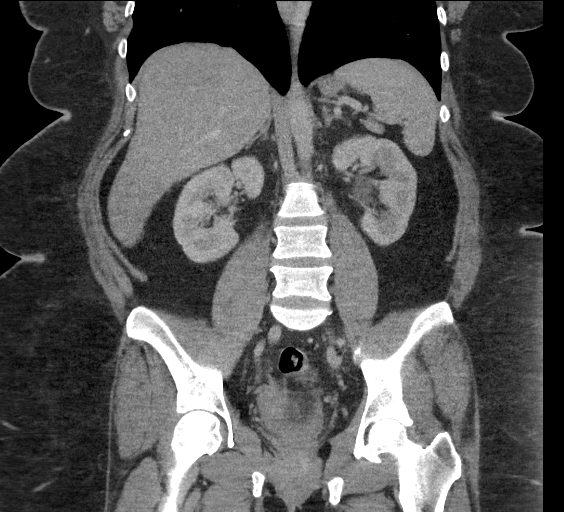

[16 of 46 positions shown; findings below may reference images not displayed]

FINDINGS: Lower chest: Minimal right lung base atelectatic changes. The
visualized lung bases are otherwise clear.

No intra-abdominal free air or free fluid.

Hepatobiliary: Apparent fatty infiltration of the liver. No
intrahepatic biliary ductal dilatation a subcentimeter hypodense
lesion in the dome of the liver is not characterized. No calcified
gallstone.

Pancreas: Unremarkable. No pancreatic ductal dilatation or
surrounding inflammatory changes.

Spleen: Normal in size without focal abnormality.

Adrenals/Urinary Tract: Adrenal glands are unremarkable. Kidneys are
normal, without renal calculi, focal lesion, or hydronephrosis.
Bladder is unremarkable.

Stomach/Bowel: Stomach is within normal limits. Appendix appears
normal. No evidence of bowel wall thickening, distention, or
inflammatory changes.

Vascular/Lymphatic: No significant vascular findings are present. No
enlarged abdominal or pelvic lymph nodes.

Reproductive: The uterus is anteverted. An intrauterine device is
noted. Evaluation of the IUD position is limited on CT. However, the
IUD appears in appropriate positioning. The ovaries are somewhat
prominent which may be related to menstrual cycle or represent
polycystic morphology. Ultrasound may provide better evaluation of
the pelvic structures if clinically indicated.

Other: None

Musculoskeletal: No acute or significant osseous findings.
IMPRESSION: 1. No acute intra-abdominopelvic pathology. No bowel obstruction or
active inflammation. Normal appendix.
2. Mild fatty liver.

## 2020-09-20 ENCOUNTER — Ambulatory Visit: Payer: Self-pay | Admitting: Orthopaedic Surgery

## 2020-10-05 IMAGING — DX CHEST - 2 VIEW
2 series · 2 of 2 positions shown · non-contrast
Comparison: 07/28/2017.

CLINICAL DATA: Chest pain.  Dry cough.

EXAM:
CHEST - 2 VIEW

[chest pa]
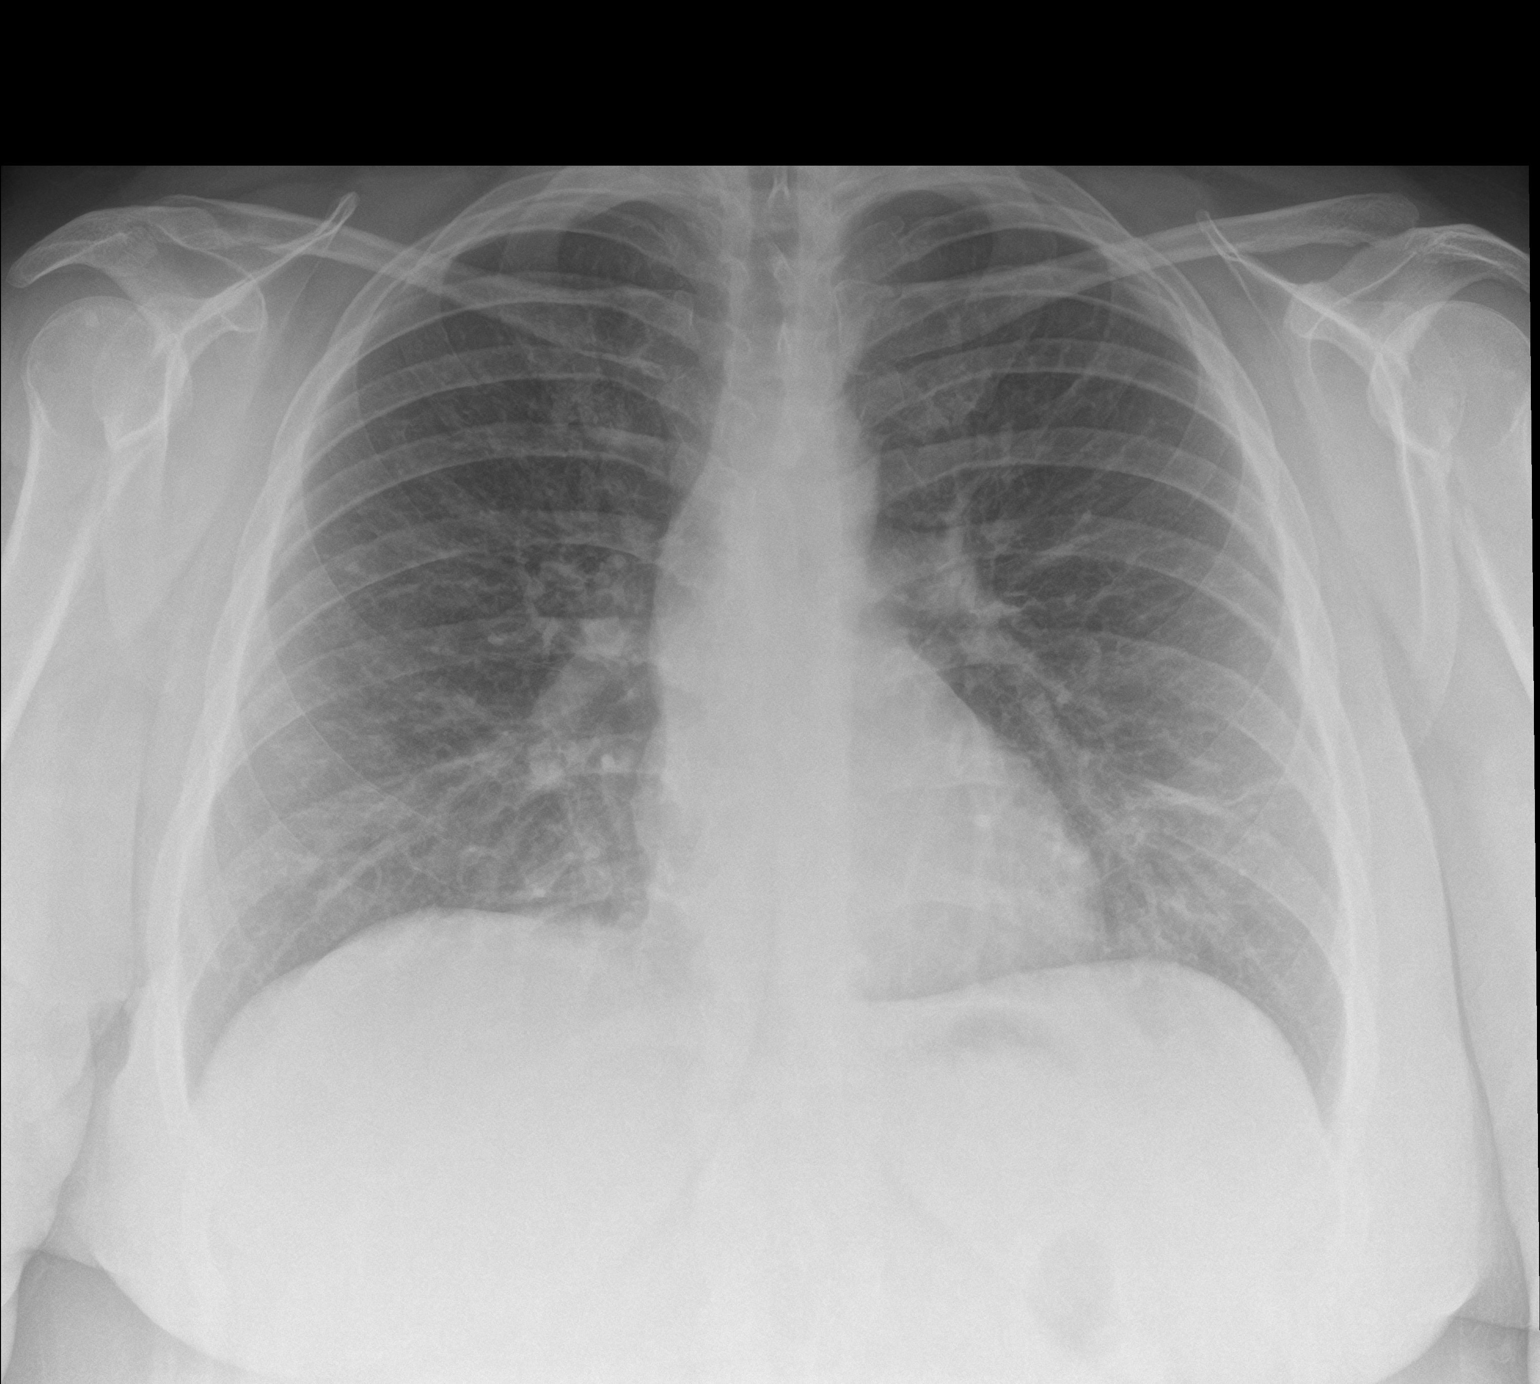

[chest lat]
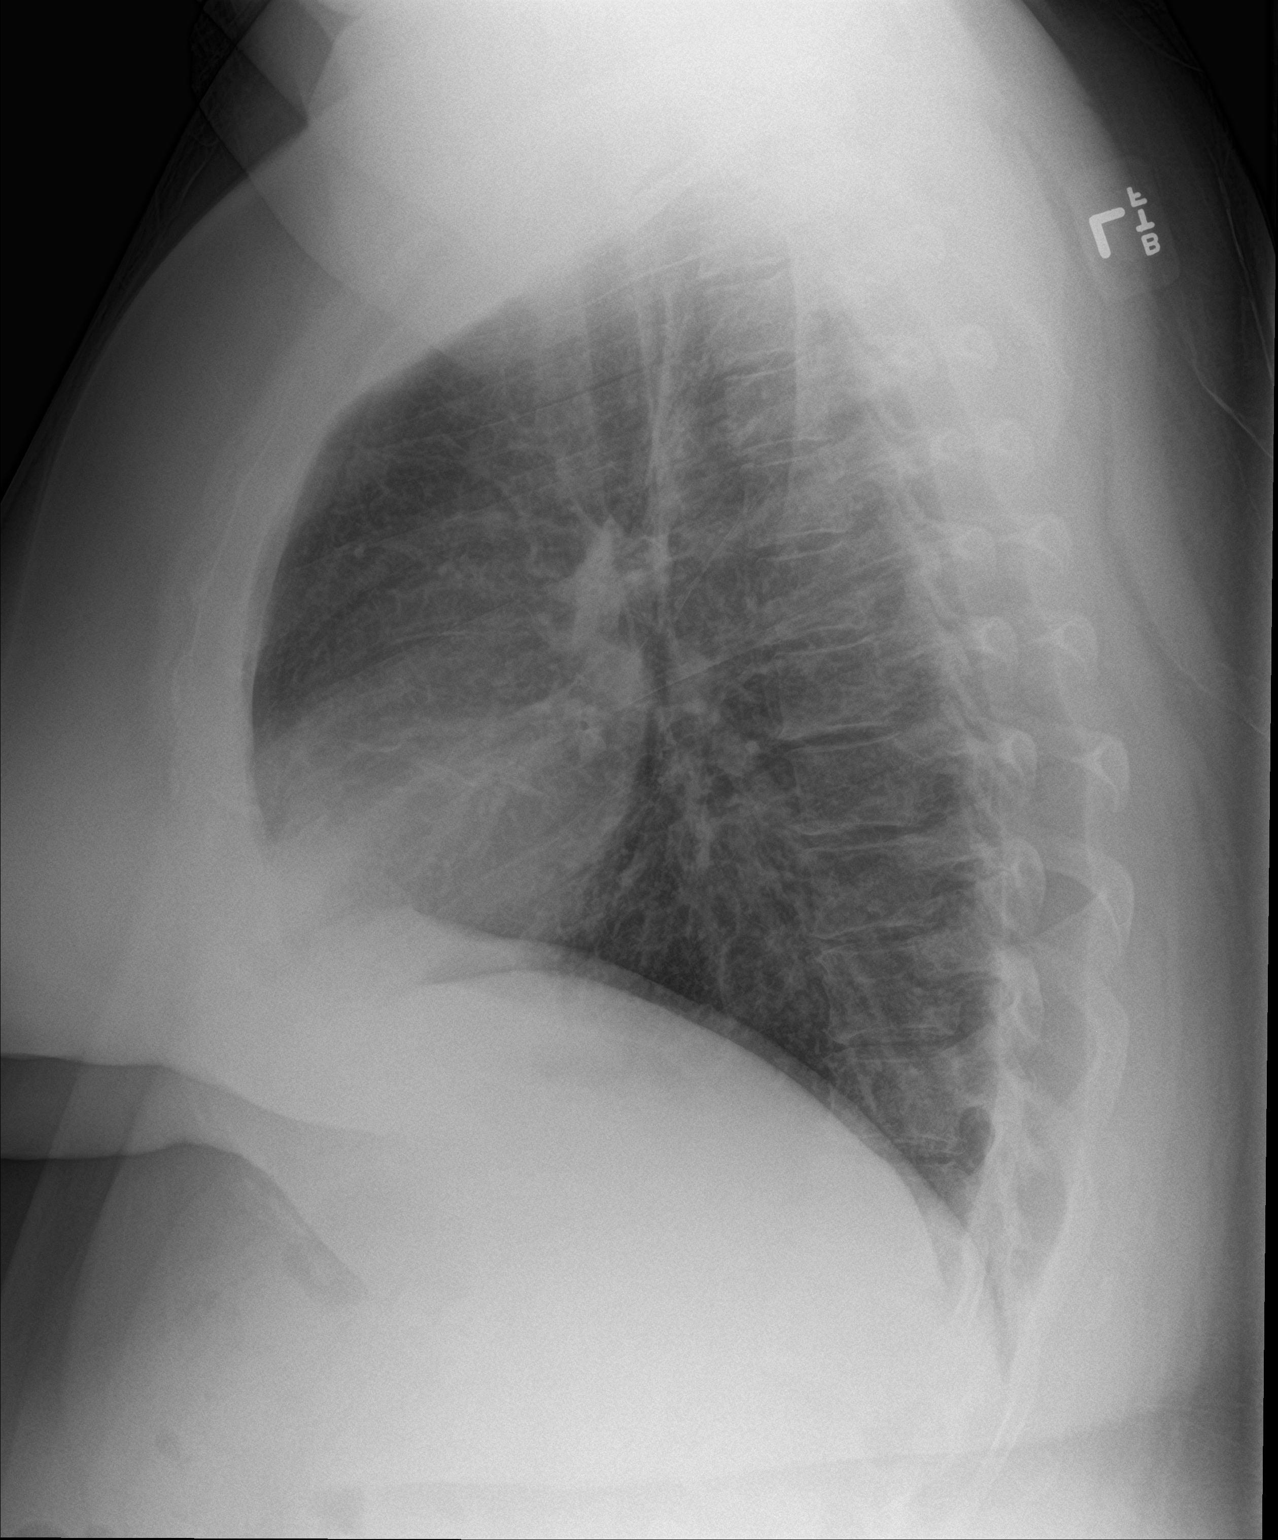

[2 of 2 positions shown; findings below may reference images not displayed]

FINDINGS: Mediastinum and hilar structures normal. Mild bibasilar atelectasis.
No pleural effusion or pneumothorax. Heart size normal.
IMPRESSION: Mild bibasilar subsegmental atelectasis.
